# Patient Record
Sex: Male | Born: 1950 | Race: White | Hispanic: No | Marital: Single | Smoking: Former smoker
Health system: Southern US, Community
[De-identification: ages and names within clinical notes are randomized; demographics above are authoritative.]

## PROBLEM LIST (undated history)

## (undated) DIAGNOSIS — R7303 Prediabetes: Secondary | ICD-10-CM

## (undated) DIAGNOSIS — R51 Headache: Secondary | ICD-10-CM

## (undated) DIAGNOSIS — E039 Hypothyroidism, unspecified: Secondary | ICD-10-CM

## (undated) DIAGNOSIS — K219 Gastro-esophageal reflux disease without esophagitis: Secondary | ICD-10-CM

## (undated) DIAGNOSIS — G4730 Sleep apnea, unspecified: Secondary | ICD-10-CM

## (undated) DIAGNOSIS — C4490 Unspecified malignant neoplasm of skin, unspecified: Secondary | ICD-10-CM

## (undated) DIAGNOSIS — J189 Pneumonia, unspecified organism: Secondary | ICD-10-CM

## (undated) DIAGNOSIS — H269 Unspecified cataract: Secondary | ICD-10-CM

## (undated) DIAGNOSIS — R112 Nausea with vomiting, unspecified: Secondary | ICD-10-CM

## (undated) DIAGNOSIS — Z87442 Personal history of urinary calculi: Secondary | ICD-10-CM

## (undated) DIAGNOSIS — M1990 Unspecified osteoarthritis, unspecified site: Secondary | ICD-10-CM

## (undated) DIAGNOSIS — C73 Malignant neoplasm of thyroid gland: Secondary | ICD-10-CM

## (undated) DIAGNOSIS — Z9889 Other specified postprocedural states: Secondary | ICD-10-CM

## (undated) DIAGNOSIS — R519 Headache, unspecified: Secondary | ICD-10-CM

## (undated) HISTORY — PX: COLONOSCOPY: SHX174

## (undated) HISTORY — PX: APPENDECTOMY: SHX54

## (undated) HISTORY — PX: SKIN CANCER EXCISION: SHX779

## (undated) HISTORY — PX: TONSILLECTOMY AND ADENOIDECTOMY: SUR1326

---

## 1989-10-24 DIAGNOSIS — C73 Malignant neoplasm of thyroid gland: Secondary | ICD-10-CM

## 1989-10-24 HISTORY — DX: Malignant neoplasm of thyroid gland: C73

## 1989-10-24 HISTORY — PX: THYROID SURGERY: SHX805

## 1999-12-01 ENCOUNTER — Ambulatory Visit (HOSPITAL_COMMUNITY): Admission: RE | Admit: 1999-12-01 | Discharge: 1999-12-01 | Payer: Self-pay | Admitting: Gastroenterology

## 2009-05-29 ENCOUNTER — Encounter: Admission: RE | Admit: 2009-05-29 | Discharge: 2009-05-29 | Payer: Self-pay | Admitting: Endocrinology

## 2010-05-28 ENCOUNTER — Encounter: Admission: RE | Admit: 2010-05-28 | Discharge: 2010-05-28 | Payer: Self-pay | Admitting: Endocrinology

## 2011-03-11 NOTE — Procedures (Signed)
. [facility]  Patient:    Joshua Graves, Joshua Graves                       MRN: [id number] Proc. Date: 12/01/99 Adm. Date:  [id number] Attending:  Rich Brave CC:         Veverly Fells. [place], M.D.                           Procedure Report  PROCEDURE:  Colonoscopy.  ENDOSCOPIST:  Florencia Reasons, M.D.  INDICATION:  [age]-year-old with history of intermittent small-volume hematochezia and family history of colon cancer.  FINDINGS:  Normal exam to the cecum.  Hypertrophied anal papilla present.  DESCRIPTION OF PROCEDURE:  The nature, purpose and risks of the procedure had been discussed with the patient who provided written consent.  Sedation was Fentanyl 60 mcg and Versed 6 mg IV, without arrhythmias or desaturation.  The Olympus adult  videocolonoscope was advanced easily to the cecum, as identified by clear visualization of the ileocecal valve and the absence of further lumen. Pullback was then performed.  The quality of the prep was excellent and it was felt that all areas were well-seen.  This was a normal examination.  No polyps, cancer, colitis, vascular malformations or diverticular disease were observed.  Retroflexion in the rectum did show a hypertrophied anal papilla.  Pullout through the anal canal did not show excessive internal hemorrhoids.  The patient tolerated the procedure well and there were o apparent complications.  IMPRESSION:  Essentially normal examination.  The patients rectal bleeding is felt most likely to be due to intermittent anal fissuring or perhaps occasional hemorrhoids.  PLAN:  Consider repeat colonoscopy in five years, in view of the family history of colon cancer. DD:  12/01/99 TD:  12/01/99 Job: [id number]

## 2011-05-27 ENCOUNTER — Ambulatory Visit
Admission: RE | Admit: 2011-05-27 | Discharge: 2011-05-27 | Disposition: A | Discharge status: Home / Self Care | Payer: BC Managed Care – PPO | Source: Ambulatory Visit | Attending: Endocrinology | Admitting: Endocrinology

## 2011-05-27 ENCOUNTER — Other Ambulatory Visit: Payer: Self-pay | Admitting: Endocrinology

## 2011-05-27 DIAGNOSIS — Z0000 Encounter for general adult medical examination without abnormal findings: Secondary | ICD-10-CM

## 2013-10-23 ENCOUNTER — Other Ambulatory Visit (HOSPITAL_COMMUNITY): Payer: Self-pay | Admitting: Nurse Practitioner

## 2013-10-23 ENCOUNTER — Ambulatory Visit (HOSPITAL_COMMUNITY)
Admission: RE | Admit: 2013-10-23 | Discharge: 2013-10-23 | Disposition: A | Discharge status: Home / Self Care | Payer: BC Managed Care – PPO | Source: Ambulatory Visit | Attending: Nurse Practitioner | Admitting: Nurse Practitioner

## 2013-10-23 DIAGNOSIS — M79672 Pain in left foot: Secondary | ICD-10-CM

## 2013-10-23 DIAGNOSIS — M79609 Pain in unspecified limb: Secondary | ICD-10-CM | POA: Insufficient documentation

## 2013-10-23 DIAGNOSIS — IMO0002 Reserved for concepts with insufficient information to code with codable children: Secondary | ICD-10-CM | POA: Insufficient documentation

## 2013-10-23 DIAGNOSIS — M7989 Other specified soft tissue disorders: Secondary | ICD-10-CM | POA: Insufficient documentation

## 2013-10-23 DIAGNOSIS — X58XXXA Exposure to other specified factors, initial encounter: Secondary | ICD-10-CM | POA: Insufficient documentation

## 2014-07-29 ENCOUNTER — Other Ambulatory Visit: Payer: Self-pay | Admitting: Endocrinology

## 2014-07-29 DIAGNOSIS — M8580 Other specified disorders of bone density and structure, unspecified site: Secondary | ICD-10-CM

## 2015-09-29 ENCOUNTER — Other Ambulatory Visit: Payer: Self-pay | Admitting: Endocrinology

## 2015-09-29 DIAGNOSIS — M8580 Other specified disorders of bone density and structure, unspecified site: Secondary | ICD-10-CM

## 2015-10-13 ENCOUNTER — Ambulatory Visit
Admission: RE | Admit: 2015-10-13 | Discharge: 2015-10-13 | Disposition: A | Discharge status: Home / Self Care | Payer: BLUE CROSS/BLUE SHIELD | Source: Ambulatory Visit | Attending: Endocrinology | Admitting: Endocrinology

## 2015-10-13 DIAGNOSIS — M8580 Other specified disorders of bone density and structure, unspecified site: Secondary | ICD-10-CM

## 2017-11-16 NOTE — Progress Notes (Signed)
Please place orders in Epic as patient is being scheduled for a pre-op appointment! Thank you! 

## 2017-12-13 NOTE — Patient Instructions (Addendum)
AUM CAGGIANO  12/13/2017   Your procedure is scheduled on: 12-20-17   Report to [facility] Main  Entrance Follow signs to Short Stay on first floor at 530 AM    Call this number if you have problems the morning of surgery [phone]   Remember: Do not eat food or drink liquids :After Midnight.     Take these medicines the morning of surgery with A SIP OF WATER: Omeprazole (Prilosec). You may also bring and use your eyedrops and inhaler as needed.                               You may not have any metal on your body including hair pins and              piercings  Do not wear jewelry, lotions, powders or deodorant             Men may shave face and neck.   Do not bring valuables to the hospital. [place].  Contacts, dentures or bridgework may not be worn into surgery.  Leave suitcase in the car. After surgery it may be brought to your room.   Please bring your mask and tubing for your CPAP machine                Please read over the following fact sheets you were given: _____________________________________________________________________          [facility] - Preparing for Surgery Before surgery, you can play an important role.  Because skin is not sterile, your skin needs to be as free of germs as possible.  You can reduce the number of germs on your skin by washing with CHG (chlorahexidine gluconate) soap before surgery.  CHG is an antiseptic cleaner which kills germs and bonds with the skin to continue killing germs even after washing. Please DO NOT use if you have an allergy to CHG or antibacterial soaps.  If your skin becomes reddened/irritated stop using the CHG and inform your nurse when you arrive at Short Stay. Do not shave (including legs and underarms) for at least 48 hours prior to the first CHG shower.  You may shave your face/neck. Please follow these instructions carefully:  1.  Shower with  CHG Soap the night before surgery and the  morning of Surgery.  2.  If you choose to wash your hair, wash your hair first as usual with your  normal  shampoo.  3.  After you shampoo, rinse your hair and body thoroughly to remove the  shampoo.                           4.  Use CHG as you would any other liquid soap.  You can apply chg directly  to the skin and wash                       Gently with a scrungie or clean washcloth.  5.  Apply the CHG Soap to your body ONLY FROM THE NECK DOWN.   Do not use on face/ open  Wound or open sores. Avoid contact with eyes, ears mouth and genitals (private parts).                       Wash face,  Genitals (private parts) with your normal soap.             6.  Wash thoroughly, paying special attention to the area where your surgery  will be performed.  7.  Thoroughly rinse your body with warm water from the neck down.  8.  DO NOT shower/wash with your normal soap after using and rinsing off  the CHG Soap.                9.  Pat yourself dry with a clean towel.            10.  Wear clean pajamas.            11.  Place clean sheets on your bed the night of your first shower and do not  sleep with pets. Day of Surgery : Do not apply any lotions/deodorants the morning of surgery.  Please wear clean clothes to the hospital/surgery center.  FAILURE TO FOLLOW THESE INSTRUCTIONS MAY RESULT IN THE CANCELLATION OF YOUR SURGERY PATIENT SIGNATURE_________________________________  NURSE SIGNATURE__________________________________  ________________________________________________________________________   Adam Phenix  An incentive spirometer is a tool that can help keep your lungs clear and active. This tool measures how well you are filling your lungs with each breath. Taking long deep breaths may help reverse or decrease the chance of developing breathing (pulmonary) problems (especially infection) following:  A long period of  time when you are unable to move or be active. BEFORE THE PROCEDURE   If the spirometer includes an indicator to show your best effort, your nurse or respiratory therapist will set it to a desired goal.  If possible, sit up straight or lean slightly forward. Try not to slouch.  Hold the incentive spirometer in an upright position. INSTRUCTIONS FOR USE  1. Sit on the edge of your bed if possible, or sit up as far as you can in bed or on a chair. 2. Hold the incentive spirometer in an upright position. 3. Breathe out normally. 4. Place the mouthpiece in your mouth and seal your lips tightly around it. 5. Breathe in slowly and as deeply as possible, raising the piston or the ball toward the top of the column. 6. Hold your breath for 3-5 seconds or for as long as possible. Allow the piston or ball to fall to the bottom of the column. 7. Remove the mouthpiece from your mouth and breathe out normally. 8. Rest for a few seconds and repeat Steps 1 through 7 at least 10 times every 1-2 hours when you are awake. Take your time and take a few normal breaths between deep breaths. 9. The spirometer may include an indicator to show your best effort. Use the indicator as a goal to work toward during each repetition. 10. After each set of 10 deep breaths, practice coughing to be sure your lungs are clear. If you have an incision (the cut made at the time of surgery), support your incision when coughing by placing a pillow or rolled up towels firmly against it. Once you are able to get out of bed, walk around indoors and cough well. You may stop using the incentive spirometer when instructed by your caregiver.  RISKS AND COMPLICATIONS  Take your time so you do not get  dizzy or light-headed.  If you are in pain, you may need to take or ask for pain medication before doing incentive spirometry. It is harder to take a deep breath if you are having pain. AFTER USE  Rest and breathe slowly and easily.  It can  be helpful to keep track of a log of your progress. Your caregiver can provide you with a simple table to help with this. If you are using the spirometer at home, follow these instructions: [place] IF:   You are having difficultly using the spirometer.  You have trouble using the spirometer as often as instructed.  Your pain medication is not giving enough relief while using the spirometer.  You develop fever of 100.5 F (38.1 C) or higher. SEEK IMMEDIATE MEDICAL CARE IF:   You cough up bloody sputum that had not been present before.  You develop fever of 102 F (38.9 C) or greater.  You develop worsening pain at or near the incision site. MAKE SURE YOU:   Understand these instructions.  Will watch your condition.  Will get help right away if you are not doing well or get worse. Document Released: 02/20/2007 Document Revised: 01/02/2012 Document Reviewed: 04/23/2007 ExitCare Patient Information 2014 ExitCare, [place].   ________________________________________________________________________  WHAT IS A BLOOD TRANSFUSION? Blood Transfusion Information  A transfusion is the replacement of blood or some of its parts. Blood is made up of multiple cells which provide different functions.  Red blood cells carry oxygen and are used for blood loss replacement.  White blood cells fight against infection.  Platelets control bleeding.  Plasma helps clot blood.  Other blood products are available for specialized needs, such as hemophilia or other clotting disorders. BEFORE THE TRANSFUSION  Who gives blood for transfusions?   Healthy volunteers who are fully evaluated to make sure their blood is safe. This is blood bank blood. Transfusion therapy is the safest it has ever been in the practice of medicine. Before blood is taken from a donor, a complete history is taken to make sure that person has no history of diseases nor engages in risky social behavior (examples are  intravenous drug use or sexual activity with multiple partners). The donor's travel history is screened to minimize risk of transmitting infections, such as malaria. The donated blood is tested for signs of infectious diseases, such as HIV and hepatitis. The blood is then tested to be sure it is compatible with you in order to minimize the chance of a transfusion reaction. If you or a relative donates blood, this is often done in anticipation of surgery and is not appropriate for emergency situations. It takes many days to process the donated blood. RISKS AND COMPLICATIONS Although transfusion therapy is very safe and saves many lives, the main dangers of transfusion include:   Getting an infectious disease.  Developing a transfusion reaction. This is an allergic reaction to something in the blood you were given. Every precaution is taken to prevent this. The decision to have a blood transfusion has been considered carefully by your caregiver before blood is given. Blood is not given unless the benefits outweigh the risks. AFTER THE TRANSFUSION  Right after receiving a blood transfusion, you will usually feel much better and more energetic. This is especially true if your red blood cells have gotten low (anemic). The transfusion raises the level of the red blood cells which carry oxygen, and this usually causes an energy increase.  The nurse administering the transfusion will  monitor you carefully for complications. HOME CARE INSTRUCTIONS  No special instructions are needed after a transfusion. You may find your energy is better. Speak with your caregiver about any limitations on activity for underlying diseases you may have. SEEK MEDICAL CARE IF:   Your condition is not improving after your transfusion.  You develop redness or irritation at the intravenous (IV) site. SEEK IMMEDIATE MEDICAL CARE IF:  Any of the following symptoms occur over the next 12 hours:  Shaking chills.  You have a  temperature by mouth above 102 F (38.9 C), not controlled by medicine.  Chest, back, or muscle pain.  People around you feel you are not acting correctly or are confused.  Shortness of breath or difficulty breathing.  Dizziness and fainting.  You get a rash or develop hives.  You have a decrease in urine output.  Your urine turns a dark color or changes to pink, red, or [place]. Any of the following symptoms occur over the next 10 days:  You have a temperature by mouth above 102 F (38.9 C), not controlled by medicine.  Shortness of breath.  Weakness after normal activity.  The white part of the eye turns yellow (jaundice).  You have a decrease in the amount of urine or are urinating less often.  Your urine turns a dark color or changes to pink, red, or [place]. Document Released: 10/07/2000 Document Revised: 01/02/2012 Document Reviewed: 05/26/2008 [facility] Patient Information 2014 [place], [place].  _______________________________________________________________________

## 2017-12-14 ENCOUNTER — Encounter (HOSPITAL_COMMUNITY)
Admission: RE | Admit: 2017-12-14 | Discharge: 2017-12-14 | Disposition: A | Discharge status: Home / Self Care | Payer: Medicare HMO | Source: Ambulatory Visit | Attending: Orthopedic Surgery | Admitting: Orthopedic Surgery

## 2017-12-14 ENCOUNTER — Ambulatory Visit (HOSPITAL_COMMUNITY)
Admission: RE | Admit: 2017-12-14 | Discharge: 2017-12-14 | Disposition: A | Discharge status: Home / Self Care | Payer: Medicare HMO | Source: Ambulatory Visit | Attending: Surgical | Admitting: Surgical

## 2017-12-14 ENCOUNTER — Other Ambulatory Visit: Payer: Self-pay

## 2017-12-14 ENCOUNTER — Encounter (HOSPITAL_COMMUNITY): Payer: Self-pay

## 2017-12-14 DIAGNOSIS — Z01812 Encounter for preprocedural laboratory examination: Secondary | ICD-10-CM | POA: Insufficient documentation

## 2017-12-14 DIAGNOSIS — Z01810 Encounter for preprocedural cardiovascular examination: Secondary | ICD-10-CM | POA: Insufficient documentation

## 2017-12-14 DIAGNOSIS — Z01818 Encounter for other preprocedural examination: Secondary | ICD-10-CM | POA: Insufficient documentation

## 2017-12-14 DIAGNOSIS — R9431 Abnormal electrocardiogram [ECG] [EKG]: Secondary | ICD-10-CM | POA: Diagnosis not present

## 2017-12-14 DIAGNOSIS — M1712 Unilateral primary osteoarthritis, left knee: Secondary | ICD-10-CM | POA: Diagnosis not present

## 2017-12-14 HISTORY — DX: Hypothyroidism, unspecified: E03.9

## 2017-12-14 HISTORY — DX: Sleep apnea, unspecified: G47.30

## 2017-12-14 HISTORY — DX: Gastro-esophageal reflux disease without esophagitis: K21.9

## 2017-12-14 HISTORY — DX: Headache, unspecified: R51.9

## 2017-12-14 HISTORY — DX: Prediabetes: R73.03

## 2017-12-14 HISTORY — DX: Headache: R51

## 2017-12-14 HISTORY — DX: Pneumonia, unspecified organism: J18.9

## 2017-12-14 LAB — URINALYSIS, ROUTINE W REFLEX MICROSCOPIC
Bilirubin Urine: NEGATIVE
Glucose, UA: NEGATIVE mg/dL
Hgb urine dipstick: NEGATIVE
Ketones, ur: NEGATIVE mg/dL
Leukocytes, UA: NEGATIVE
Nitrite: NEGATIVE
Protein, ur: NEGATIVE mg/dL
Specific Gravity, Urine: 1.016 (ref 1.005–1.030)
pH: 5 (ref 5.0–8.0)

## 2017-12-14 LAB — COMPREHENSIVE METABOLIC PANEL
ALT: 28 U/L (ref 17–63)
AST: 21 U/L (ref 15–41)
Albumin: 4.4 g/dL (ref 3.5–5.0)
Alkaline Phosphatase: 87 U/L (ref 38–126)
Anion gap: 11 (ref 5–15)
BUN: 19 mg/dL (ref 6–20)
CO2: 24 mmol/L (ref 22–32)
Calcium: 9.2 mg/dL (ref 8.9–10.3)
Chloride: 106 mmol/L (ref 101–111)
Creatinine, Ser: 0.9 mg/dL (ref 0.61–1.24)
GFR calc Af Amer: >60 mL/min (ref >60)
GFR calc non Af Amer: >60 mL/min (ref >60)
Glucose, Bld: 91 mg/dL (ref 65–99)
Potassium: 4.3 mmol/L (ref 3.5–5.1)
Sodium: 141 mmol/L (ref 135–145)
Total Bilirubin: 1.4 mg/dL — ABNORMAL HIGH (ref 0.3–1.2)
Total Protein: 7.2 g/dL (ref 6.5–8.1)

## 2017-12-14 LAB — CBC WITH DIFFERENTIAL/PLATELET
Basophils Absolute: 0 10*3/uL (ref 0.0–0.1)
Basophils Relative: 0 %
Eosinophils Absolute: 0 10*3/uL (ref 0.0–0.7)
Eosinophils Relative: 0 %
HCT: 45.5 % (ref 39.0–52.0)
Hemoglobin: 15.3 g/dL (ref 13.0–17.0)
Lymphocytes Relative: 8 %
Lymphs Abs: 0.9 10*3/uL (ref 0.7–4.0)
MCH: 33.1 pg (ref 26.0–34.0)
MCHC: 33.6 g/dL (ref 30.0–36.0)
MCV: 98.5 fL (ref 78.0–100.0)
Monocytes Absolute: 0.7 10*3/uL (ref 0.1–1.0)
Monocytes Relative: 7 %
Neutro Abs: 9.5 10*3/uL — ABNORMAL HIGH (ref 1.7–7.7)
Neutrophils Relative %: 85 %
Platelets: 245 10*3/uL (ref 150–400)
RBC: 4.62 MIL/uL (ref 4.22–5.81)
RDW: 13.5 % (ref 11.5–15.5)
WBC: 11.3 10*3/uL — ABNORMAL HIGH (ref 4.0–10.5)

## 2017-12-14 LAB — APTT: aPTT: 32 seconds (ref 24–36)

## 2017-12-14 LAB — PROTIME-INR
INR: 0.97
Prothrombin Time: 12.8 seconds (ref 11.4–15.2)

## 2017-12-14 LAB — SURGICAL PCR SCREEN
MRSA, PCR: NEGATIVE
Staphylococcus aureus: NEGATIVE

## 2017-12-15 LAB — ABO/RH: ABO/RH(D): O POS

## 2017-12-15 LAB — HEMOGLOBIN A1C
Hgb A1c MFr Bld: 5.9 % — ABNORMAL HIGH (ref 4.8–5.6)
MEAN PLASMA GLUCOSE: 122.63 mg/dL

## 2017-12-15 NOTE — Progress Notes (Signed)
Called to advise patient that he was negative for Staph. Pt reported that he just came down with a low grade fever and congestion yesterday. Pt stated that he feels awful, and has been resting all day. Patient advised to contact Dr. Buzzy Han office. Also left voice message with Vel[facility] to advise of the above.   Spoke to Dr. Smith Robert regarding EKG result. Pt does report chest pain with exercise. Pt is a smoker and stated that he assumed it was Pulmonary related. Dr. Smith Robert indicated that they will evaluate pt on day of surgery.

## 2017-12-20 ENCOUNTER — Encounter (HOSPITAL_COMMUNITY): Admission: RE | Payer: Self-pay | Source: Ambulatory Visit

## 2017-12-20 ENCOUNTER — Inpatient Hospital Stay (HOSPITAL_COMMUNITY): Admission: RE | Admit: 2017-12-20 | Payer: Medicare HMO | Source: Ambulatory Visit | Admitting: Orthopedic Surgery

## 2017-12-20 LAB — TYPE AND SCREEN
ABO/RH(D): O POS
Antibody Screen: NEGATIVE

## 2017-12-20 SURGERY — ARTHROPLASTY, KNEE, TOTAL
Anesthesia: Choice | Site: Knee | Laterality: Left

## 2018-02-08 ENCOUNTER — Other Ambulatory Visit: Payer: Self-pay | Admitting: Surgical

## 2018-02-08 ENCOUNTER — Ambulatory Visit: Payer: Self-pay | Admitting: Surgical

## 2018-02-14 ENCOUNTER — Encounter (HOSPITAL_COMMUNITY): Payer: Self-pay

## 2018-02-14 NOTE — Pre-Procedure Instructions (Signed)
Surgical clearance from Dr. Elyse Hsu on chart  The following are in epic: EKG 12/14/2017

## 2018-02-14 NOTE — Patient Instructions (Addendum)
Your procedure is scheduled on: Wednesday, Feb 21, 2018   Surgery Time:  8:30AM-10:30AM   Report to Center  Entrance    Report to admitting at 6:00 AM   Call this number if you have problems the morning of surgery [phone]   Bring CPAP mask and tubing day of surgery   Do not eat food or drink liquids :After Midnight.   Do NOT smoke after Midnight   Take these medicines the morning of surgery with A SIP OF WATER: Omeprazole                               You may not have any metal on your body including jewelry, and body piercings               Do not wear lotions, powders, perfumes/cologne, or deodorant                          Men may shave face and neck.   Do not bring valuables to the hospital. [place].   Contacts, dentures or bridgework may not be worn into surgery.   Leave suitcase in the car. After surgery it may be brought to your room.   Special Instructions: Bring a copy of your healthcare power of attorney and living will documents         the day of surgery if you haven't scanned them in before.              Please read over the following fact sheets you were given:  [facility] - Preparing for Surgery Before surgery, you can play an important role.  Because skin is not sterile, your skin needs to be as free of germs as possible.  You can reduce the number of germs on your skin by washing with CHG (chlorahexidine gluconate) soap before surgery.  CHG is an antiseptic cleaner which kills germs and bonds with the skin to continue killing germs even after washing. Please DO NOT use if you have an allergy to CHG or antibacterial soaps.  If your skin becomes reddened/irritated stop using the CHG and inform your nurse when you arrive at Short Stay. Do not shave (including legs and underarms) for at least 48 hours prior to the first CHG shower.  You may shave your face/neck.  Please follow these  instructions carefully:  1.  Shower with CHG Soap the night before surgery and the  morning of surgery.  2.  If you choose to wash your hair, wash your hair first as usual with your normal  shampoo.  3.  After you shampoo, rinse your hair and body thoroughly to remove the shampoo.                             4.  Use CHG as you would any other liquid soap.  You can apply chg directly to the skin and wash.  Gently with a scrungie or clean washcloth.  5.  Apply the CHG Soap to your body ONLY FROM THE NECK DOWN.   Do   not use on face/ open  Wound or open sores. Avoid contact with eyes, ears mouth and   genitals (private parts).                       Wash face,  Genitals (private parts) with your normal soap.             6.  Wash thoroughly, paying special attention to the area where your    surgery  will be performed.  7.  Thoroughly rinse your body with warm water from the neck down.  8.  DO NOT shower/wash with your normal soap after using and rinsing off the CHG Soap.                9.  Pat yourself dry with a clean towel.            10.  Wear clean pajamas.            11.  Place clean sheets on your bed the night of your first shower and do not  sleep with pets. Day of Surgery : Do not apply any lotions/deodorants the morning of surgery.  Please wear clean clothes to the hospital/surgery center.  FAILURE TO FOLLOW THESE INSTRUCTIONS MAY RESULT IN THE CANCELLATION OF YOUR SURGERY  PATIENT SIGNATURE_________________________________  NURSE SIGNATURE__________________________________  ________________________________________________________________________   Adam Phenix  An incentive spirometer is a tool that can help keep your lungs clear and active. This tool measures how well you are filling your lungs with each breath. Taking long deep breaths may help reverse or decrease the chance of developing breathing (pulmonary) problems (especially infection)  following:  A long period of time when you are unable to move or be active. BEFORE THE PROCEDURE   If the spirometer includes an indicator to show your best effort, your nurse or respiratory therapist will set it to a desired goal.  If possible, sit up straight or lean slightly forward. Try not to slouch.  Hold the incentive spirometer in an upright position. INSTRUCTIONS FOR USE  1. Sit on the edge of your bed if possible, or sit up as far as you can in bed or on a chair. 2. Hold the incentive spirometer in an upright position. 3. Breathe out normally. 4. Place the mouthpiece in your mouth and seal your lips tightly around it. 5. Breathe in slowly and as deeply as possible, raising the piston or the ball toward the top of the column. 6. Hold your breath for 3-5 seconds or for as long as possible. Allow the piston or ball to fall to the bottom of the column. 7. Remove the mouthpiece from your mouth and breathe out normally. 8. Rest for a few seconds and repeat Steps 1 through 7 at least 10 times every 1-2 hours when you are awake. Take your time and take a few normal breaths between deep breaths. 9. The spirometer may include an indicator to show your best effort. Use the indicator as a goal to work toward during each repetition. 10. After each set of 10 deep breaths, practice coughing to be sure your lungs are clear. If you have an incision (the cut made at the time of surgery), support your incision when coughing by placing a pillow or rolled up towels firmly against it. Once you are able to get out of bed, walk around indoors and cough well. You may stop using the incentive spirometer when instructed by your caregiver.  RISKS AND COMPLICATIONS  Take your time  so you do not get dizzy or light-headed.  If you are in pain, you may need to take or ask for pain medication before doing incentive spirometry. It is harder to take a deep breath if you are having pain. AFTER USE  Rest and  breathe slowly and easily.  It can be helpful to keep track of a log of your progress. Your caregiver can provide you with a simple table to help with this. If you are using the spirometer at home, follow these instructions: [place] IF:   You are having difficultly using the spirometer.  You have trouble using the spirometer as often as instructed.  Your pain medication is not giving enough relief while using the spirometer.  You develop fever of 100.5 F (38.1 C) or higher. SEEK IMMEDIATE MEDICAL CARE IF:   You cough up bloody sputum that had not been present before.  You develop fever of 102 F (38.9 C) or greater.  You develop worsening pain at or near the incision site. MAKE SURE YOU:   Understand these instructions.  Will watch your condition.  Will get help right away if you are not doing well or get worse. Document Released: 02/20/2007 Document Revised: 01/02/2012 Document Reviewed: 04/23/2007 Exit[employer] Patient Information 2014 ExitCare, [place].   ________________________________________________________________________  WHAT IS A BLOOD TRANSFUSION? Blood Transfusion Information  A transfusion is the replacement of blood or some of its parts. Blood is made up of multiple cells which provide different functions.  Red blood cells carry oxygen and are used for blood loss replacement.  White blood cells fight against infection.  Platelets control bleeding.  Plasma helps clot blood.  Other blood products are available for specialized needs, such as hemophilia or other clotting disorders. BEFORE THE TRANSFUSION  Who gives blood for transfusions?   Healthy volunteers who are fully evaluated to make sure their blood is safe. This is blood bank blood. Transfusion therapy is the safest it has ever been in the practice of medicine. Before blood is taken from a donor, a complete history is taken to make sure that person has no history of diseases nor engages in  risky social behavior (examples are intravenous drug use or sexual activity with multiple partners). The donor's travel history is screened to minimize risk of transmitting infections, such as malaria. The donated blood is tested for signs of infectious diseases, such as HIV and hepatitis. The blood is then tested to be sure it is compatible with you in order to minimize the chance of a transfusion reaction. If you or a relative donates blood, this is often done in anticipation of surgery and is not appropriate for emergency situations. It takes many days to process the donated blood. RISKS AND COMPLICATIONS Although transfusion therapy is very safe and saves many lives, the main dangers of transfusion include:   Getting an infectious disease.  Developing a transfusion reaction. This is an allergic reaction to something in the blood you were given. Every precaution is taken to prevent this. The decision to have a blood transfusion has been considered carefully by your caregiver before blood is given. Blood is not given unless the benefits outweigh the risks. AFTER THE TRANSFUSION  Right after receiving a blood transfusion, you will usually feel much better and more energetic. This is especially true if your red blood cells have gotten low (anemic). The transfusion raises the level of the red blood cells which carry oxygen, and this usually causes an energy increase.  The  nurse administering the transfusion will monitor you carefully for complications. HOME CARE INSTRUCTIONS  No special instructions are needed after a transfusion. You may find your energy is better. Speak with your caregiver about any limitations on activity for underlying diseases you may have. SEEK MEDICAL CARE IF:   Your condition is not improving after your transfusion.  You develop redness or irritation at the intravenous (IV) site. SEEK IMMEDIATE MEDICAL CARE IF:  Any of the following symptoms occur over the next 12  hours:  Shaking chills.  You have a temperature by mouth above 102 F (38.9 C), not controlled by medicine.  Chest, back, or muscle pain.  People around you feel you are not acting correctly or are confused.  Shortness of breath or difficulty breathing.  Dizziness and fainting.  You get a rash or develop hives.  You have a decrease in urine output.  Your urine turns a dark color or changes to pink, red, or [place]. Any of the following symptoms occur over the next 10 days:  You have a temperature by mouth above 102 F (38.9 C), not controlled by medicine.  Shortness of breath.  Weakness after normal activity.  The white part of the eye turns yellow (jaundice).  You have a decrease in the amount of urine or are urinating less often.  Your urine turns a dark color or changes to pink, red, or [place]. Document Released: 10/07/2000 Document Revised: 01/02/2012 Document Reviewed: 05/26/2008 [facility] Patient Information 2014 [place], [place].  _______________________________________________________________________

## 2018-02-15 ENCOUNTER — Encounter (HOSPITAL_COMMUNITY)
Admission: RE | Admit: 2018-02-15 | Discharge: 2018-02-15 | Disposition: A | Discharge status: Home / Self Care | Payer: Medicare HMO | Source: Ambulatory Visit | Attending: Orthopedic Surgery | Admitting: Orthopedic Surgery

## 2018-02-15 ENCOUNTER — Ambulatory Visit (HOSPITAL_COMMUNITY)
Admission: RE | Admit: 2018-02-15 | Discharge: 2018-02-15 | Disposition: A | Discharge status: Home / Self Care | Payer: Medicare HMO | Source: Ambulatory Visit | Attending: Anesthesiology | Admitting: Anesthesiology

## 2018-02-15 ENCOUNTER — Encounter (HOSPITAL_COMMUNITY): Payer: Self-pay

## 2018-02-15 ENCOUNTER — Other Ambulatory Visit: Payer: Self-pay

## 2018-02-15 DIAGNOSIS — Z01812 Encounter for preprocedural laboratory examination: Secondary | ICD-10-CM | POA: Diagnosis not present

## 2018-02-15 DIAGNOSIS — Z01818 Encounter for other preprocedural examination: Secondary | ICD-10-CM

## 2018-02-15 DIAGNOSIS — Z01810 Encounter for preprocedural cardiovascular examination: Secondary | ICD-10-CM | POA: Insufficient documentation

## 2018-02-15 DIAGNOSIS — M1712 Unilateral primary osteoarthritis, left knee: Secondary | ICD-10-CM | POA: Insufficient documentation

## 2018-02-15 HISTORY — DX: Malignant neoplasm of thyroid gland: C73

## 2018-02-15 HISTORY — DX: Unspecified osteoarthritis, unspecified site: M19.90

## 2018-02-15 LAB — COMPREHENSIVE METABOLIC PANEL
ALT: 50 U/L (ref 17–63)
AST: 26 U/L (ref 15–41)
Albumin: 4.1 g/dL (ref 3.5–5.0)
Alkaline Phosphatase: 79 U/L (ref 38–126)
Anion gap: 11 (ref 5–15)
BUN: 19 mg/dL (ref 6–20)
CO2: 24 mmol/L (ref 22–32)
Calcium: 9 mg/dL (ref 8.9–10.3)
Chloride: 106 mmol/L (ref 101–111)
Creatinine, Ser: 0.92 mg/dL (ref 0.61–1.24)
GFR calc Af Amer: >60 mL/min (ref >60)
GFR calc non Af Amer: >60 mL/min (ref >60)
Glucose, Bld: 97 mg/dL (ref 65–99)
Potassium: 4.5 mmol/L (ref 3.5–5.1)
Sodium: 141 mmol/L (ref 135–145)
Total Bilirubin: 1.4 mg/dL — ABNORMAL HIGH (ref 0.3–1.2)
Total Protein: 7 g/dL (ref 6.5–8.1)

## 2018-02-15 LAB — CBC WITH DIFFERENTIAL/PLATELET
Basophils Absolute: 0 10*3/uL (ref 0.0–0.1)
Basophils Relative: 0 %
Eosinophils Absolute: 0.2 10*3/uL (ref 0.0–0.7)
Eosinophils Relative: 3 %
HCT: 41.5 % (ref 39.0–52.0)
Hemoglobin: 13.9 g/dL (ref 13.0–17.0)
Lymphocytes Relative: 22 %
Lymphs Abs: 1.4 10*3/uL (ref 0.7–4.0)
MCH: 32.3 pg (ref 26.0–34.0)
MCHC: 33.5 g/dL (ref 30.0–36.0)
MCV: 96.3 fL (ref 78.0–100.0)
Monocytes Absolute: 0.7 10*3/uL (ref 0.1–1.0)
Monocytes Relative: 12 %
Neutro Abs: 3.8 10*3/uL (ref 1.7–7.7)
Neutrophils Relative %: 63 %
Platelets: 221 10*3/uL (ref 150–400)
RBC: 4.31 MIL/uL (ref 4.22–5.81)
RDW: 13.7 % (ref 11.5–15.5)
WBC: 6.1 10*3/uL (ref 4.0–10.5)

## 2018-02-15 LAB — URINALYSIS, ROUTINE W REFLEX MICROSCOPIC
Bilirubin Urine: NEGATIVE
Glucose, UA: NEGATIVE mg/dL
Hgb urine dipstick: NEGATIVE
Ketones, ur: NEGATIVE mg/dL
Leukocytes, UA: NEGATIVE
Nitrite: NEGATIVE
Protein, ur: NEGATIVE mg/dL
Specific Gravity, Urine: 1.019 (ref 1.005–1.030)
pH: 5 (ref 5.0–8.0)

## 2018-02-15 LAB — PROTIME-INR
INR: 0.99
Prothrombin Time: 13 seconds (ref 11.4–15.2)

## 2018-02-15 LAB — HEMOGLOBIN A1C
HEMOGLOBIN A1C: 5.6 % (ref 4.8–5.6)
Mean Plasma Glucose: 114.02 mg/dL

## 2018-02-15 LAB — SURGICAL PCR SCREEN
MRSA, PCR: NEGATIVE
STAPHYLOCOCCUS AUREUS: NEGATIVE

## 2018-02-15 LAB — APTT: aPTT: 29 seconds (ref 24–36)

## 2018-02-20 MED ORDER — BUPIVACAINE LIPOSOME 1.3 % IJ SUSP
20.0000 mL | Freq: Once | INTRAMUSCULAR | Status: AC
  Administered 2018-02-21: 20 mL
  Filled 2018-02-20: qty 20

## 2018-02-20 NOTE — H&P (Signed)
TOTAL KNEE ADMISSION H&P  Patient is being admitted for left total knee arthroplasty.  Subjective:  Chief Complaint:left knee pain.  HPI: Joshua Graves, 67 y.o. male, has a history of pain and functional disability in the left knee due to arthritis and has failed non-surgical conservative treatments for greater than 12 weeks to includeNSAID's and/or analgesics, corticosteriod injections, viscosupplementation injections, flexibility and strengthening excercises and activity modification.  Onset of symptoms was gradual, starting 5 years ago with gradually worsening course since that time. The patient noted prior procedures on the knee to include  arthroscopy and menisectomy on the left knee(s).  Patient currently rates pain in the left knee(s) at 8 out of 10 with activity. Patient has night pain, worsening of pain with activity and weight bearing, pain that interferes with activities of daily living, pain with passive range of motion, crepitus and joint swelling.  Patient has evidence of periarticular osteophytes and joint space narrowing by imaging studies. There is no active infection.   Past Medical History:  Diagnosis Date  . Anginal pain ([place])   . Depression    Medication prescribed, but patient did not get prescription filled  . GERD (gastroesophageal reflux disease)   . Headache    Sinus headaches  . Hypothyroidism   . OA (osteoarthritis)   . Pneumonia   . Pre-diabetes   . Sleep apnea    CPAP  . Thyroid cancer ([place]) 1991    Past Surgical History:  Procedure Laterality Date  . APPENDECTOMY    . COLONOSCOPY    . THYROID SURGERY  1991  . TONSILLECTOMY AND ADENOIDECTOMY      Current Facility-Administered Medications  Medication Dose Route Frequency Provider Last Rate Last Dose  . [START ON 02/21/2018] bupivacaine liposome (EXPAREL) 1.3 % injection 266 mg  20 mL Infiltration Once Latanya Maudlin, MD       Current Outpatient Medications  Medication Sig Dispense Refill Last Dose   . aspirin EC 81 MG tablet Take 81 mg by mouth daily.     Marland Kitchen atorvastatin (LIPITOR) 40 MG tablet Take 40 mg by mouth at bedtime.      . Calcium Carbonate-Vitamin D (CALTRATE 600+D) 600-400 MG-UNIT tablet Take 1 tablet by mouth 2 (two) times daily.      . Cholecalciferol (VITAMIN D) 2000 units CAPS Take 2,000 Units by mouth daily.     . fluticasone (FLONASE) 50 MCG/ACT nasal spray Place 1 spray into both nostrils 2 (two) times daily as needed (for allergies.).      Marland Kitchen ibuprofen (ADVIL,MOTRIN) 200 MG tablet Take 400-800 mg by mouth 3 (three) times daily as needed for headache or moderate pain.      Marland Kitchen levothyroxine (SYNTHROID, LEVOTHROID) 200 MCG tablet Take 200 mcg by mouth at bedtime.      . Multiple Vitamin (MULTIVITAMIN WITH MINERALS) TABS tablet Take 1 tablet by mouth daily.     . Omega-3 Fatty Acids (FISH OIL PO) Take 660 mg by mouth daily.      Marland Kitchen omeprazole (PRILOSEC OTC) 20 MG tablet Take 20 mg by mouth daily.      No Known Allergies  Social History   Tobacco Use  . Smoking status: Former Smoker    Packs/day: 2.00    Years: 52.00    Pack years: 104.00    Types: Cigarettes    Last attempt to quit: 12/21/2017    Years since quitting: 0.1  . Smokeless tobacco: Never Used  Substance Use Topics  . Alcohol use: Yes  Alcohol/week: 3.6 oz    Types: 6 Cans of beer per week    Comment: Every 2-3 days      Review of Systems  Constitutional: Negative.   HENT: Positive for hearing loss. Negative for congestion, ear discharge, ear pain, nosebleeds, sinus pain, sore throat and tinnitus.   Eyes: Negative.   Respiratory: Negative.  Negative for stridor.   Cardiovascular: Negative.   Gastrointestinal: Negative.   Genitourinary: Negative.   Musculoskeletal: Positive for joint pain and myalgias. Negative for back pain, falls and neck pain.  Skin: Negative.   Neurological: Negative.   Endo/Heme/Allergies: Negative.   Psychiatric/Behavioral: Negative.     Objective:  Physical Exam   Constitutional: He is oriented to person, place, and time. He appears well-developed. No distress.  Obese  HENT:  Head: Normocephalic and atraumatic.  Right Ear: External ear normal.  Left Ear: External ear normal.  Nose: Nose normal.  Mouth/Throat: Oropharynx is clear and moist.  Eyes: Conjunctivae and EOM are normal.  Neck: Normal range of motion. Neck supple.  Cardiovascular: Normal rate, regular rhythm, normal heart sounds and intact distal pulses.  No murmur heard. Respiratory: Effort normal and breath sounds normal. No respiratory distress. He has no wheezes.  GI: Soft. Bowel sounds are normal. He exhibits no distension. There is no tenderness.  Musculoskeletal:       Right hip: Normal.       Left hip: Normal.       Right knee: Normal.       Left knee: He exhibits decreased range of motion and swelling. He exhibits no effusion and no erythema. Tenderness found. Medial joint line and lateral joint line tenderness noted.  Moderate tenderness to palpation about the medial joint line of the left knee. ROM left knee 5-105 degrees. No effusion noted in the left knee. No instability about the left knee. Moderate patellofemoral crepitus in the left knee. Genu varus.  Neurological: He is alert and oriented to person, place, and time. He has normal strength and normal reflexes. No sensory deficit.  Skin: No rash noted. He is not diaphoretic. No erythema.  Psychiatric: He has a normal mood and affect. His behavior is normal.   Vitals Ht: 6 ft 1 in  Wt: 238 lbs  BMI: 31.4  BP: 145/95  HR:     84 bpm  Imaging Review Plain radiographs demonstrate severe degenerative joint disease of the left knee(s). The overall alignment ismild varus. The bone quality appears to be good for age and reported activity level.   Preoperative templating of the joint replacement has been completed, documented, and submitted to the Operating Room personnel in order to optimize intra-operative equipment  management.   Anticipated LOS equal to or greater than 2 midnights due to - Age 78 and older with one or more of the following:  - Obesity  - Expected need for hospital services (PT, OT, Nursing) required for safe  discharge  - Anticipated need for postoperative skilled nursing care or inpatient rehab  - Active co-morbidities: none OR   - Unanticipated findings during/Post Surgery: None  - Patient is a high risk of re-admission due to: None     Assessment/Plan:  End stage primary osteoarthritis, left knee   The patient history, physical examination, clinical judgment of the provider and imaging studies are consistent with end stage degenerative joint disease of the left knee(s) and total knee arthroplasty is deemed medically necessary. The treatment options including medical management, injection therapy arthroscopy and arthroplasty were  discussed at length. The risks and benefits of total knee arthroplasty were presented and reviewed. The risks due to aseptic loosening, infection, stiffness, patella tracking problems, thromboembolic complications and other imponderables were discussed. The patient acknowledged the explanation, agreed to proceed with the plan and consent was signed. Patient is being admitted for inpatient treatment for surgery, pain control, PT, OT, prophylactic antibiotics, VTE prophylaxis, progressive ambulation and ADL's and discharge planning. The patient is planning to be discharged home with home health services    Therapy Plans: HHPT then outpatient therapy Disposition: Home with girlffriend Planned DVT prophylaxis: aspirin 325mg  BID DME needed: cane and 3-n-1 PCP: Dr. Elyse Hsu Other: TXA IV    Ardeen Jourdain, PA-C

## 2018-02-20 NOTE — Anesthesia Preprocedure Evaluation (Addendum)
Anesthesia Evaluation  Patient identified by MRN, date of birth, ID band Patient awake    Reviewed: Allergy & Precautions, NPO status , Patient's Chart, lab work & pertinent test results  Airway Mallampati: III  TM Distance: >3 FB Neck ROM: Full    Dental  (+) Chipped,    Pulmonary sleep apnea and Continuous Positive Airway Pressure Ventilation , former smoker,    Pulmonary exam normal breath sounds clear to auscultation       Cardiovascular negative cardio ROS Normal cardiovascular exam Rhythm:Regular Rate:Normal  ECG: NSR, rate 89   Neuro/Psych  Headaches, PSYCHIATRIC DISORDERS Depression    GI/Hepatic Neg liver ROS, GERD  Medicated and Controlled,  Endo/Other  Hypothyroidism   Renal/GU negative Renal ROS     Musculoskeletal negative musculoskeletal ROS (+)   Abdominal (+) + obese,   Peds  Hematology HLD   Anesthesia Other Findings Left Knee Osteoarthritis  Reproductive/Obstetrics                            Anesthesia Physical Anesthesia Plan  ASA: III  Anesthesia Plan: Spinal and Regional   Post-op Pain Management:  Regional for Post-op pain   Induction:   PONV Risk Score and Plan: 1 and Ondansetron, Dexamethasone, Propofol infusion, Treatment may vary due to age or medical condition and Midazolam  Airway Management Planned: Natural Airway  Additional Equipment:   Intra-op Plan:   Post-operative Plan:   Informed Consent: I have reviewed the patients History and Physical, chart, labs and discussed the procedure including the risks, benefits and alternatives for the proposed anesthesia with the patient or authorized representative who has indicated his/her understanding and acceptance.   Dental advisory given  Plan Discussed with: CRNA  Anesthesia Plan Comments:        Anesthesia Quick Evaluation

## 2018-02-21 ENCOUNTER — Other Ambulatory Visit: Payer: Self-pay

## 2018-02-21 ENCOUNTER — Inpatient Hospital Stay (HOSPITAL_COMMUNITY): Payer: Medicare HMO | Admitting: Anesthesiology

## 2018-02-21 ENCOUNTER — Inpatient Hospital Stay (HOSPITAL_COMMUNITY)
Admission: RE | Admit: 2018-02-21 | Discharge: 2018-02-23 | DRG: 470 | Disposition: A | Discharge status: Home Health | Payer: Medicare HMO | Source: Ambulatory Visit | Attending: Orthopedic Surgery | Admitting: Orthopedic Surgery

## 2018-02-21 ENCOUNTER — Encounter (HOSPITAL_COMMUNITY): Payer: Self-pay

## 2018-02-21 ENCOUNTER — Encounter (HOSPITAL_COMMUNITY)
Admission: RE | Disposition: A | Discharge status: Home Health | Payer: Self-pay | Source: Ambulatory Visit | Attending: Orthopedic Surgery

## 2018-02-21 DIAGNOSIS — Z6831 Body mass index (BMI) 31.0-31.9, adult: Secondary | ICD-10-CM

## 2018-02-21 DIAGNOSIS — E785 Hyperlipidemia, unspecified: Secondary | ICD-10-CM | POA: Diagnosis present

## 2018-02-21 DIAGNOSIS — E039 Hypothyroidism, unspecified: Secondary | ICD-10-CM | POA: Diagnosis present

## 2018-02-21 DIAGNOSIS — K219 Gastro-esophageal reflux disease without esophagitis: Secondary | ICD-10-CM | POA: Diagnosis present

## 2018-02-21 DIAGNOSIS — Z7982 Long term (current) use of aspirin: Secondary | ICD-10-CM

## 2018-02-21 DIAGNOSIS — M24562 Contracture, left knee: Secondary | ICD-10-CM | POA: Diagnosis present

## 2018-02-21 DIAGNOSIS — Z85850 Personal history of malignant neoplasm of thyroid: Secondary | ICD-10-CM

## 2018-02-21 DIAGNOSIS — Z87891 Personal history of nicotine dependence: Secondary | ICD-10-CM

## 2018-02-21 DIAGNOSIS — M1712 Unilateral primary osteoarthritis, left knee: Secondary | ICD-10-CM | POA: Diagnosis present

## 2018-02-21 DIAGNOSIS — Z96652 Presence of left artificial knee joint: Secondary | ICD-10-CM

## 2018-02-21 DIAGNOSIS — E669 Obesity, unspecified: Secondary | ICD-10-CM | POA: Diagnosis present

## 2018-02-21 DIAGNOSIS — G4730 Sleep apnea, unspecified: Secondary | ICD-10-CM | POA: Diagnosis present

## 2018-02-21 HISTORY — PX: TOTAL KNEE ARTHROPLASTY: SHX125

## 2018-02-21 LAB — TYPE AND SCREEN
ABO/RH(D): O POS
Antibody Screen: NEGATIVE

## 2018-02-21 SURGERY — ARTHROPLASTY, KNEE, TOTAL
Anesthesia: Regional | Site: Knee | Laterality: Left

## 2018-02-21 MED ORDER — HYDROCODONE-ACETAMINOPHEN 7.5-325 MG PO TABS
1.0000 | ORAL_TABLET | ORAL | Status: DC | PRN
  Administered 2018-02-22: 2 via ORAL
  Filled 2018-02-21: qty 2

## 2018-02-21 MED ORDER — METOCLOPRAMIDE HCL 5 MG/ML IJ SOLN: 5.0000 mg | Freq: Three times a day (TID) | INTRAMUSCULAR | Status: DC | PRN

## 2018-02-21 MED ORDER — PHENYLEPHRINE HCL 10 MG/ML IJ SOLN: 30.0000 ug/min | INTRAVENOUS | Status: DC

## 2018-02-21 MED ORDER — ONDANSETRON HCL 4 MG/2ML IJ SOLN: 4.0000 mg | Freq: Four times a day (QID) | INTRAMUSCULAR | Status: DC | PRN

## 2018-02-21 MED ORDER — CHLORHEXIDINE GLUCONATE 4 % EX LIQD: 60.0000 mL | Freq: Once | CUTANEOUS | Status: DC

## 2018-02-21 MED ORDER — LACTATED RINGERS IV SOLN
INTRAVENOUS | Status: DC
  Administered 2018-02-21 (×2): via INTRAVENOUS

## 2018-02-21 MED ORDER — GABAPENTIN 300 MG PO CAPS
300.0000 mg | ORAL_CAPSULE | Freq: Three times a day (TID) | ORAL | Status: DC
  Administered 2018-02-21 – 2018-02-23 (×6): 300 mg via ORAL
  Filled 2018-02-21 (×3): qty 1
  Filled 2018-02-21: qty 3
  Filled 2018-02-21 (×2): qty 1

## 2018-02-21 MED ORDER — THIAMINE HCL 100 MG/ML IJ SOLN: 100.0000 mg | Freq: Once | INTRAMUSCULAR | Status: DC

## 2018-02-21 MED ORDER — CEFAZOLIN SODIUM-DEXTROSE 2-4 GM/100ML-% IV SOLN
INTRAVENOUS | Status: AC
  Filled 2018-02-21: qty 100

## 2018-02-21 MED ORDER — BISACODYL 5 MG PO TBEC: 5.0000 mg | DELAYED_RELEASE_TABLET | Freq: Every day | ORAL | Status: DC | PRN

## 2018-02-21 MED ORDER — LACTATED RINGERS IV SOLN
INTRAVENOUS | Status: DC
  Administered 2018-02-21 – 2018-02-22 (×3): via INTRAVENOUS

## 2018-02-21 MED ORDER — ACETAMINOPHEN 500 MG PO TABS
500.0000 mg | ORAL_TABLET | Freq: Four times a day (QID) | ORAL | Status: AC
  Administered 2018-02-21 – 2018-02-22 (×3): 500 mg via ORAL
  Filled 2018-02-21 (×3): qty 1

## 2018-02-21 MED ORDER — PHENOL 1.4 % MT LIQD: 1.0000 | OROMUCOSAL | Status: DC | PRN

## 2018-02-21 MED ORDER — METOCLOPRAMIDE HCL 5 MG PO TABS: 5.0000 mg | ORAL_TABLET | Freq: Three times a day (TID) | ORAL | Status: DC | PRN

## 2018-02-21 MED ORDER — MORPHINE SULFATE (PF) 2 MG/ML IV SOLN: 0.5000 mg | INTRAVENOUS | Status: DC | PRN

## 2018-02-21 MED ORDER — ASPIRIN EC 325 MG PO TBEC: 325.0000 mg | DELAYED_RELEASE_TABLET | Freq: Two times a day (BID) | ORAL | 0 refills | Status: DC

## 2018-02-21 MED ORDER — SODIUM CHLORIDE 0.9 % IJ SOLN
INTRAMUSCULAR | Status: DC | PRN
  Administered 2018-02-21: 20 mL

## 2018-02-21 MED ORDER — PROPOFOL 10 MG/ML IV BOLUS
INTRAVENOUS | Status: DC | PRN
  Administered 2018-02-21 (×2): 30 mg via INTRAVENOUS
  Administered 2018-02-21 (×2): 20 mg via INTRAVENOUS

## 2018-02-21 MED ORDER — SODIUM CHLORIDE 0.9 % IV SOLN
INTRAVENOUS | Status: AC
  Filled 2018-02-21: qty 500000

## 2018-02-21 MED ORDER — FENTANYL CITRATE (PF) 100 MCG/2ML IJ SOLN: 25.0000 ug | INTRAMUSCULAR | Status: DC | PRN

## 2018-02-21 MED ORDER — PHENYLEPHRINE HCL 10 MG/ML IJ SOLN
INTRAVENOUS | Status: DC | PRN
  Administered 2018-02-21: 40 ug/min via INTRAVENOUS

## 2018-02-21 MED ORDER — LORAZEPAM 1 MG PO TABS: 1.0000 mg | ORAL_TABLET | Freq: Every day | ORAL | Status: DC

## 2018-02-21 MED ORDER — DOCUSATE SODIUM 100 MG PO CAPS
100.0000 mg | ORAL_CAPSULE | Freq: Two times a day (BID) | ORAL | Status: DC
  Administered 2018-02-22 – 2018-02-23 (×3): 100 mg via ORAL
  Filled 2018-02-21 (×4): qty 1

## 2018-02-21 MED ORDER — DEXAMETHASONE SODIUM PHOSPHATE 10 MG/ML IJ SOLN
INTRAMUSCULAR | Status: DC | PRN
  Administered 2018-02-21: 10 mg via INTRAVENOUS

## 2018-02-21 MED ORDER — CEFAZOLIN SODIUM-DEXTROSE 1-4 GM/50ML-% IV SOLN
1.0000 g | Freq: Four times a day (QID) | INTRAVENOUS | Status: AC
  Administered 2018-02-21 (×2): 1 g via INTRAVENOUS
  Filled 2018-02-21 (×2): qty 50

## 2018-02-21 MED ORDER — ACETAMINOPHEN 325 MG PO TABS: 325.0000 mg | ORAL_TABLET | Freq: Four times a day (QID) | ORAL | Status: DC | PRN

## 2018-02-21 MED ORDER — LORAZEPAM 1 MG PO TABS: 1.0000 mg | ORAL_TABLET | Freq: Two times a day (BID) | ORAL | Status: DC

## 2018-02-21 MED ORDER — POLYMYXIN B SULFATE 500000 UNITS IJ SOLR
INTRAMUSCULAR | Status: DC | PRN
  Administered 2018-02-21: 500 mL

## 2018-02-21 MED ORDER — LOPERAMIDE HCL 2 MG PO CAPS: 2.0000 mg | ORAL_CAPSULE | ORAL | Status: DC | PRN

## 2018-02-21 MED ORDER — PROPOFOL 500 MG/50ML IV EMUL
INTRAVENOUS | Status: DC | PRN
  Administered 2018-02-21: 100 ug/kg/min via INTRAVENOUS

## 2018-02-21 MED ORDER — ONDANSETRON 4 MG PO TBDP: 4.0000 mg | ORAL_TABLET | Freq: Four times a day (QID) | ORAL | Status: DC | PRN

## 2018-02-21 MED ORDER — GABAPENTIN 300 MG PO CAPS: 300.0000 mg | ORAL_CAPSULE | Freq: Three times a day (TID) | ORAL | 0 refills | Status: DC

## 2018-02-21 MED ORDER — ROPIVACAINE HCL 5 MG/ML IJ SOLN
INTRAMUSCULAR | Status: DC | PRN
  Administered 2018-02-21: 30 mL via PERINEURAL

## 2018-02-21 MED ORDER — HYDROCODONE-ACETAMINOPHEN 5-325 MG PO TABS
1.0000 | ORAL_TABLET | ORAL | Status: DC | PRN
  Administered 2018-02-21 – 2018-02-23 (×9): 2 via ORAL
  Filled 2018-02-21 (×9): qty 2

## 2018-02-21 MED ORDER — MIDAZOLAM HCL 2 MG/2ML IJ SOLN
1.0000 mg | INTRAMUSCULAR | Status: DC | PRN
  Administered 2018-02-21: 2 mg via INTRAVENOUS

## 2018-02-21 MED ORDER — SODIUM CHLORIDE 0.9 % IJ SOLN
INTRAMUSCULAR | Status: AC
  Filled 2018-02-21: qty 50

## 2018-02-21 MED ORDER — FLEET ENEMA 7-19 GM/118ML RE ENEM: 1.0000 | ENEMA | Freq: Once | RECTAL | Status: DC | PRN

## 2018-02-21 MED ORDER — FERROUS SULFATE 325 (65 FE) MG PO TABS
325.0000 mg | ORAL_TABLET | Freq: Three times a day (TID) | ORAL | Status: DC
  Administered 2018-02-22 – 2018-02-23 (×5): 325 mg via ORAL
  Filled 2018-02-21 (×5): qty 1

## 2018-02-21 MED ORDER — METHYLPREDNISOLONE ACETATE 80 MG/ML IJ SUSP
80.0000 mg | Freq: Once | INTRAMUSCULAR | Status: DC
  Filled 2018-02-21: qty 1

## 2018-02-21 MED ORDER — LORAZEPAM 1 MG PO TABS
1.0000 mg | ORAL_TABLET | Freq: Three times a day (TID) | ORAL | Status: AC
  Administered 2018-02-22 – 2018-02-23 (×3): 1 mg via ORAL
  Filled 2018-02-21 (×3): qty 1

## 2018-02-21 MED ORDER — MIDAZOLAM HCL 2 MG/2ML IJ SOLN
INTRAMUSCULAR | Status: AC
  Filled 2018-02-21: qty 2

## 2018-02-21 MED ORDER — MIDAZOLAM HCL 5 MG/5ML IJ SOLN
INTRAMUSCULAR | Status: DC | PRN
  Administered 2018-02-21: 1 mg via INTRAVENOUS

## 2018-02-21 MED ORDER — ONDANSETRON HCL 4 MG/2ML IJ SOLN: 4.0000 mg | Freq: Once | INTRAMUSCULAR | Status: DC | PRN

## 2018-02-21 MED ORDER — LEVOTHYROXINE SODIUM 100 MCG PO TABS
200.0000 ug | ORAL_TABLET | Freq: Every day | ORAL | Status: DC
  Administered 2018-02-21 – 2018-02-22 (×2): 200 ug via ORAL
  Filled 2018-02-21 (×2): qty 2

## 2018-02-21 MED ORDER — PROPOFOL 10 MG/ML IV BOLUS
INTRAVENOUS | Status: AC
  Filled 2018-02-21: qty 20

## 2018-02-21 MED ORDER — FENTANYL CITRATE (PF) 100 MCG/2ML IJ SOLN
INTRAMUSCULAR | Status: AC
  Filled 2018-02-21: qty 2

## 2018-02-21 MED ORDER — FENTANYL CITRATE (PF) 100 MCG/2ML IJ SOLN
50.0000 ug | INTRAMUSCULAR | Status: DC | PRN
  Administered 2018-02-21: 50 ug via INTRAVENOUS

## 2018-02-21 MED ORDER — RIVAROXABAN 10 MG PO TABS
10.0000 mg | ORAL_TABLET | Freq: Every day | ORAL | Status: DC
  Administered 2018-02-22 – 2018-02-23 (×2): 10 mg via ORAL
  Filled 2018-02-21 (×2): qty 1

## 2018-02-21 MED ORDER — ALUM & MAG HYDROXIDE-SIMETH 200-200-20 MG/5ML PO SUSP: 30.0000 mL | ORAL | Status: DC | PRN

## 2018-02-21 MED ORDER — PROPOFOL 10 MG/ML IV BOLUS
INTRAVENOUS | Status: AC
  Filled 2018-02-21: qty 40

## 2018-02-21 MED ORDER — BUPIVACAINE IN DEXTROSE 0.75-8.25 % IT SOLN
INTRATHECAL | Status: DC | PRN
  Administered 2018-02-21: 2 mL via INTRATHECAL

## 2018-02-21 MED ORDER — MENTHOL 3 MG MT LOZG: 1.0000 | LOZENGE | OROMUCOSAL | Status: DC | PRN

## 2018-02-21 MED ORDER — CEFAZOLIN SODIUM-DEXTROSE 2-4 GM/100ML-% IV SOLN
2.0000 g | INTRAVENOUS | Status: AC
  Administered 2018-02-21: 2 g via INTRAVENOUS

## 2018-02-21 MED ORDER — TRANEXAMIC ACID 1000 MG/10ML IV SOLN
1000.0000 mg | INTRAVENOUS | Status: AC
  Administered 2018-02-21: 1000 mg via INTRAVENOUS
  Filled 2018-02-21: qty 1100

## 2018-02-21 MED ORDER — PROPOFOL 10 MG/ML IV BOLUS
INTRAVENOUS | Status: AC
  Filled 2018-02-21: qty 60

## 2018-02-21 MED ORDER — VITAMIN B-1 100 MG PO TABS
100.0000 mg | ORAL_TABLET | Freq: Every day | ORAL | Status: DC
  Administered 2018-02-21 – 2018-02-23 (×3): 100 mg via ORAL
  Filled 2018-02-21 (×3): qty 1

## 2018-02-21 MED ORDER — HYDROXYZINE HCL 25 MG PO TABS: 25.0000 mg | ORAL_TABLET | Freq: Four times a day (QID) | ORAL | Status: DC | PRN

## 2018-02-21 MED ORDER — POLYETHYLENE GLYCOL 3350 17 G PO PACK: 17.0000 g | PACK | Freq: Every day | ORAL | Status: DC | PRN

## 2018-02-21 MED ORDER — LORAZEPAM 1 MG PO TABS
1.0000 mg | ORAL_TABLET | Freq: Four times a day (QID) | ORAL | Status: AC
  Administered 2018-02-21 – 2018-02-22 (×4): 1 mg via ORAL
  Filled 2018-02-21 (×4): qty 1

## 2018-02-21 MED ORDER — ADULT MULTIVITAMIN W/MINERALS CH
1.0000 | ORAL_TABLET | Freq: Every day | ORAL | Status: DC
  Administered 2018-02-21 – 2018-02-23 (×3): 1 via ORAL
  Filled 2018-02-21 (×3): qty 1

## 2018-02-21 MED ORDER — FLUTICASONE PROPIONATE 50 MCG/ACT NA SUSP: 1.0000 | Freq: Two times a day (BID) | NASAL | Status: DC | PRN

## 2018-02-21 MED ORDER — OMEPRAZOLE MAGNESIUM 20 MG PO TBEC: 20.0000 mg | DELAYED_RELEASE_TABLET | Freq: Every day | ORAL | Status: DC

## 2018-02-21 MED ORDER — SODIUM CHLORIDE 0.9 % IR SOLN
Status: DC | PRN
  Administered 2018-02-21: 1000 mL

## 2018-02-21 MED ORDER — OMEPRAZOLE 20 MG PO CPDR
20.0000 mg | DELAYED_RELEASE_CAPSULE | Freq: Every day | ORAL | Status: DC
  Administered 2018-02-22 – 2018-02-23 (×2): 20 mg via ORAL
  Filled 2018-02-21 (×2): qty 1

## 2018-02-21 MED ORDER — FENTANYL CITRATE (PF) 100 MCG/2ML IJ SOLN
INTRAMUSCULAR | Status: DC | PRN
  Administered 2018-02-21: 50 ug via INTRAVENOUS

## 2018-02-21 MED ORDER — LORAZEPAM 1 MG PO TABS: 1.0000 mg | ORAL_TABLET | Freq: Four times a day (QID) | ORAL | Status: DC | PRN

## 2018-02-21 MED ORDER — ONDANSETRON HCL 4 MG PO TABS
4.0000 mg | ORAL_TABLET | Freq: Four times a day (QID) | ORAL | Status: DC | PRN
Start: 2018-02-21 — End: 2018-02-23

## 2018-02-21 SURGICAL SUPPLY — 68 items
AGENT HMST SPONGE THK3/8 (HEMOSTASIS) ×1
BAG DECANTER FOR FLEXI CONT (MISCELLANEOUS) ×2 IMPLANT
BAG SPEC THK2 15X12 ZIP CLS (MISCELLANEOUS)
BAG ZIPLOCK 12X15 (MISCELLANEOUS) IMPLANT
BANDAGE ACE 4X5 VEL STRL LF (GAUZE/BANDAGES/DRESSINGS) ×3 IMPLANT
BANDAGE ACE 6X5 VEL STRL LF (GAUZE/BANDAGES/DRESSINGS) ×3 IMPLANT
BLADE SAG 18X100X1.27 (BLADE) ×3 IMPLANT
BLADE SAW SGTL 11.0X1.19X90.0M (BLADE) ×3 IMPLANT
BNDG GAUZE ELAST 4 BULKY (GAUZE/BANDAGES/DRESSINGS) ×4 IMPLANT
CAP KNEE TOTAL 3 SIGMA ×2 IMPLANT
CEMENT HV SMART SET (Cement) ×6 IMPLANT
CLOSURE WOUND 1/2 X4 (GAUZE/BANDAGES/DRESSINGS) ×1
COVER SURGICAL LIGHT HANDLE (MISCELLANEOUS) ×3 IMPLANT
CUFF TOURN SGL QUICK 34 (TOURNIQUET CUFF) ×3
CUFF TRNQT CYL 34X4X40X1 (TOURNIQUET CUFF) ×1 IMPLANT
DECANTER SPIKE VIAL GLASS SM (MISCELLANEOUS) ×3 IMPLANT
DRAPE INCISE IOBAN 66X45 STRL (DRAPES) ×2 IMPLANT
DRAPE TOP 10253 STERILE (DRAPES) IMPLANT
DRAPE U-SHAPE 47X51 STRL (DRAPES) ×3 IMPLANT
DRSG ADAPTIC 3X8 NADH LF (GAUZE/BANDAGES/DRESSINGS) ×3 IMPLANT
DRSG PAD ABDOMINAL 8X10 ST (GAUZE/BANDAGES/DRESSINGS) ×6 IMPLANT
DURAPREP 26ML APPLICATOR (WOUND CARE) ×3 IMPLANT
ELECT BLADE TIP CTD 4 INCH (ELECTRODE) ×3 IMPLANT
ELECT REM PT RETURN 15FT ADLT (MISCELLANEOUS) ×3 IMPLANT
EVACUATOR 1/8 PVC DRAIN (DRAIN) ×3 IMPLANT
FACESHIELD WRAPAROUND (MASK) ×3 IMPLANT
FACESHIELD WRAPAROUND OR TEAM (MASK) ×1 IMPLANT
GAUZE SPONGE 4X4 12PLY STRL (GAUZE/BANDAGES/DRESSINGS) ×3 IMPLANT
GLOVE BIOGEL PI IND STRL 6.5 (GLOVE) ×1 IMPLANT
GLOVE BIOGEL PI IND STRL 8.5 (GLOVE) ×1 IMPLANT
GLOVE BIOGEL PI INDICATOR 6.5 (GLOVE) ×2
GLOVE BIOGEL PI INDICATOR 8.5 (GLOVE) ×2
GLOVE ECLIPSE 8.0 STRL XLNG CF (GLOVE) ×6 IMPLANT
GLOVE SURG SS PI 6.5 STRL IVOR (GLOVE) ×3 IMPLANT
GOWN STRL REUS W/TWL LRG LVL3 (GOWN DISPOSABLE) ×3 IMPLANT
GOWN STRL REUS W/TWL XL LVL3 (GOWN DISPOSABLE) ×3 IMPLANT
HANDPIECE INTERPULSE COAX TIP (DISPOSABLE) ×3
HEMOSTAT SPONGE AVITENE ULTRA (HEMOSTASIS) ×3 IMPLANT
IMMOBILIZER KNEE 20 (SOFTGOODS) ×3
IMMOBILIZER KNEE 20 THIGH 36 (SOFTGOODS) ×1 IMPLANT
MANIFOLD NEPTUNE II (INSTRUMENTS) ×3 IMPLANT
NDL HYPO 21X1.5 SAFETY (NEEDLE) IMPLANT
NEEDLE HYPO 21X1.5 SAFETY (NEEDLE) ×3 IMPLANT
NEEDLE HYPO 22GX1.5 SAFETY (NEEDLE) ×2 IMPLANT
NS IRRIG 1000ML POUR BTL (IV SOLUTION) ×2 IMPLANT
PACK TOTAL KNEE CUSTOM (KITS) ×3 IMPLANT
PAD ABD 8X10 STRL (GAUZE/BANDAGES/DRESSINGS) ×4 IMPLANT
PADDING CAST COTTON 6X4 STRL (CAST SUPPLIES) ×3 IMPLANT
PENCIL SMOKE EVAC W/HOLSTER (ELECTROSURGICAL) ×3 IMPLANT
POSITIONER SURGICAL ARM (MISCELLANEOUS) ×3 IMPLANT
SET HNDPC FAN SPRY TIP SCT (DISPOSABLE) ×1 IMPLANT
SET PAD KNEE POSITIONER (MISCELLANEOUS) ×3 IMPLANT
SPONGE LAP 18X18 RF (DISPOSABLE) IMPLANT
STRIP CLOSURE SKIN 1/2X4 (GAUZE/BANDAGES/DRESSINGS) ×3 IMPLANT
SUT BONE WAX W31G (SUTURE) IMPLANT
SUT MNCRL AB 4-0 PS2 18 (SUTURE) ×3 IMPLANT
SUT STRATAFIX 0 PDS 27 VIOLET (SUTURE) ×3
SUT VIC AB 1 CT1 27 (SUTURE) ×3
SUT VIC AB 1 CT1 27XBRD ANTBC (SUTURE) ×1 IMPLANT
SUT VIC AB 2-0 CT1 27 (SUTURE) ×12
SUT VIC AB 2-0 CT1 TAPERPNT 27 (SUTURE) ×3 IMPLANT
SUTURE STRATFX 0 PDS 27 VIOLET (SUTURE) ×1 IMPLANT
SYR 20CC LL (SYRINGE) ×6 IMPLANT
TOWER CARTRIDGE SMART MIX (DISPOSABLE) ×3 IMPLANT
TRAY FOLEY W/METER SILVER 16FR (SET/KITS/TRAYS/PACK) ×3 IMPLANT
WATER STERILE IRR 1000ML POUR (IV SOLUTION) ×3 IMPLANT
WRAP KNEE MAXI GEL POST OP (GAUZE/BANDAGES/DRESSINGS) ×3 IMPLANT
YANKAUER SUCT BULB TIP 10FT TU (MISCELLANEOUS) ×3 IMPLANT

## 2018-02-21 NOTE — Anesthesia Procedure Notes (Addendum)
Spinal  Patient location during procedure: OR Start time: 02/21/2018 8:32 AM End time: 02/21/2018 8:39 AM Reason for block: at surgeon's request Staffing Anesthesiologist: Murvin Natal, MD Resident/CRNA: Anne Fu, CRNA Performed: resident/CRNA and anesthesiologist  Preanesthetic Checklist Completed: patient identified, site marked, surgical consent, pre-op evaluation, timeout performed, IV checked, risks and benefits discussed and monitors and equipment checked Spinal Block Patient position: sitting Prep: DuraPrep Patient monitoring: heart rate, continuous pulse ox and blood pressure Approach: midline Location: L4-5 Injection technique: single-shot Needle Needle type: Pencan  Needle gauge: 24 G Needle length: 9 cm Assessment Sensory level: T6 Additional Notes Expiration date of kit checked and confirmed. Patient tolerated procedure well, without complications. X 2 attempt with noted clear CSF return. Loss of motor and sensory on exam post injection.

## 2018-02-21 NOTE — Op Note (Signed)
NAME: VANN, OKERLUND MEDICAL RECORD [id number] ACCOUNT [id number] DATE OF BIRTH:January 07, 1951 FACILITY: [facility] LOCATION: [facility] PHYSICIAN:Sincere Berlanga Fransico Setters, MD  OPERATIVE REPORT  DATE OF PROCEDURE:  02/21/2018  SURGEON:  Dr. Latanya Maudlin.  ASSISTANT:  Ardeen Jourdain, PA.  PREOPERATIVE DIAGNOSES:   1.  Severe primary osteoarthritis, left knee, with bone on bone. 2.  Flexion contracture, left knee.  POSTOPERATIVE DIAGNOSES:   1.  Severe primary osteoarthritis, left knee, with bone on bone. 2.  Flexion contracture, left knee.  PROCEDURE:  Left total knee arthroplasty utilizing the DePuy system.  All 3 components were cemented.  The sizes of the components were as follows:  The patella was a size 41 with 3 pegs.  The femoral component was a size 5 left posterior cruciate  sacrificing type.  The tibial tray was a size 5.  The insert was a size 5, 15 mm thickness polyethylene rotating platform.  DESCRIPTION OF PROCEDURE:  Under general anesthesia, routine orthopedic prep and draping of left lower extremity was carried out.  Appropriate timeout was first carried out.  I also appropriately marked the left leg in the holding area.  At this time,  after the timeout and prep, the leg was then placed in the [facility] knee holder after we exsanguinated the leg with the Esmarch and the tourniquet was elevated to 325 mmHg.  The knee then was flexed.  An anterior approach to the knee was carried out.  Two  flaps were created.  I then carried out a median parapatellar incision.  The patella was reflected laterally in the usual fashion.  The knee was flexed and I did medial and lateral meniscectomies and excised the anterior and posterior cruciate ligaments.   Following that, initial drill hole was made in the intercondylar notch.  A guide rod was inserted up the femoral canal.  I then removed 12 mm thickness off of the distal femur with the appropriate guide.  We then measured the femur to be a size 5.   We  carried out our anterior, posterior, and chamfering cuts for a size 5 left femoral component.  We then directed attention to the tibia.  I removed the spinous processes with the oscillating saw.  At this time, we utilized the external guide and we  removed the appropriate amount of bone off the tibial plateau.  Following that, we then inserted our lamina spreaders and removed posterior spurs.  I then tested the ligamentous structures with the appropriate insert.  We felt that a 15 mm spacer block  was the appropriate size.  We then removed out and carried on the remaining part of the procedure.  I did a keel cut out of the tibial plateau in usual fashion.  We then did our notch cut at the distal femur.  The trial components were inserted.  We  first tried a 12.5 and then went to a 15 mm thickness component, had an excellent fit, excellent stability and good motion.  We then did a resurfacing procedure on the patella for a size 41 patella.  Three drill holes were made in the articular surface  of the patella.  At this particular time, all components were removed.  We thoroughly Waterpik'd  out the knee, dried the knee and cemented all 3 components in simultaneously.  No antibiotic was used in the cement.  Note, he had 2 grams of IV Ancef  preop.  At this time, after the components were cemented in place and the cement was hardened,  we then removed all loose pieces of cement.  We Waterpik'd out the knee and then I inserted the permanent rotating platform polyethylene, size 5, 15 mm  thickness.  We reduced the knee.  We had excellent function.  We then irrigated out the knee again and then inserted our Hemovac drain and closed the wound in layers in the usual fashion.  Sterile dressings were applied.  He had an adductor preop by  anesthesia in the left leg.  He also had 2 grams of IV Ancef preop.  He also had tranexamic acid, or TXA, at the beginning of the procedure.  The patient then was  stable.  SN/NUANCE  D:02/21/2018 T:02/21/2018 JOB:[id number]

## 2018-02-21 NOTE — Interval H&P Note (Signed)
History and Physical Interval Note:  02/21/2018 8:16 AM  Joshua Graves  has presented today for surgery, with the diagnosis of Left Knee Osteoarthritis  The various methods of treatment have been discussed with the patient and family. After consideration of risks, benefits and other options for treatment, the patient has consented to  Procedure(s): LEFT TOTAL KNEE ARTHROPLASTY (Left) as a surgical intervention .  The patient's history has been reviewed, patient examined, no change in status, stable for surgery.  I have reviewed the patient's chart and labs.  Questions were answered to the patient's satisfaction.     Latanya Maudlin

## 2018-02-21 NOTE — Transfer of Care (Signed)
Immediate Anesthesia Transfer of Care Note  Patient: Joshua Graves  Procedure(s) Performed: Procedure(s): LEFT TOTAL KNEE ARTHROPLASTY (Left)  Patient Location: PACU  Anesthesia Type:Regional and Spinal  Level of Consciousness:  sedated, patient cooperative and responds to stimulation  Airway & Oxygen Therapy:Patient Spontanous Breathing and Patient connected to face mask oxgen  Post-op Assessment:  Report given to PACU RN and Post -op Vital signs reviewed and stable  Post vital signs:  Reviewed and stable  Last Vitals:  Vitals:   02/21/18 0656  BP: 138/88  Pulse: 66  Resp: 16  Temp: 36.8 C  SpO2: 78%    Complications: No apparent anesthesia complications

## 2018-02-21 NOTE — Brief Op Note (Signed)
02/21/2018  10:27 AM  PATIENT:  Joshua Graves  67 y.o. male  PRE-OPERATIVE DIAGNOSIS:  Left Knee Primary  Osteoarthritis with Flexion contracture  POST-OPERATIVE DIAGNOSIS:  Same as Pre-Op  PROCEDURE:  Procedure(s): LEFT TOTAL KNEE ARTHROPLASTY (Left)  SURGEON:  Surgeon(s) and Role:    Latanya Maudlin, MD - Primary  PHYSICIAN ASSISTANT: Ardeen Jourdain PA  ASSISTANTS:Amber Thurman PA   ANESTHESIA:   general  EBL:  100 mL   BLOOD ADMINISTERED:none  DRAINS: (one ) Hemovact drain(s) in the Left Knee with  Suction Open   LOCAL MEDICATIONS USED:  OTHER 20cc of Exparel and Anesthesia performed an Adductor Block Pre-Op  SPECIMEN:  No Specimen  DISPOSITION OF SPECIMEN:  N/A  COUNTS:  YES  TOURNIQUET:  * Missing tourniquet times found for documented tourniquets in log: [id number] *  DICTATION: .Other Dictation: Dictation Number [id number]  PLAN OF CARE: Admit to inpatient   PATIENT DISPOSITION:  Stable in OR   Delay start of Pharmacological VTE agent (>24hrs) due to surgical blood loss or risk of bleeding: yes

## 2018-02-21 NOTE — Evaluation (Signed)
Physical Therapy Evaluation Patient Details Name: Joshua Graves MRN: [id number] DOB: September 25, 1951 Today's Date: 02/21/2018   History of Present Illness  Pt s/p R TKR  Clinical Impression  Pt s/p R TKR and presents with decreased R LE strength/ROM and post op pain limiting functional mobility.  Pt should progress to dc home with assist of family/friends.    Follow Up Recommendations Home health PT    Equipment Recommendations  None recommended by PT    Recommendations for Other Services       Precautions / Restrictions Precautions Precautions: Knee;Fall Required Braces or Orthoses: Knee Immobilizer - Right Knee Immobilizer - Right: Discontinue once straight leg raise with < 10 degree lag Restrictions Weight Bearing Restrictions: No Other Position/Activity Restrictions: WBAT      Mobility  Bed Mobility Overal bed mobility: Needs Assistance Bed Mobility: Supine to Sit     Supine to sit: Min assist     General bed mobility comments: cues for sequence and min asist for R LE  Transfers Overall transfer level: Needs assistance Equipment used: Rolling walker (2 wheeled) Transfers: Sit to/from Stand Sit to Stand: Min assist         General transfer comment: cues for LE management and use of UEs to self assist  Ambulation/Gait Ambulation/Gait assistance: Min assist Ambulation Distance (Feet): 60 Feet Assistive device: Rolling walker (2 wheeled) Gait Pattern/deviations: Step-to pattern;Decreased step length - right;Decreased step length - left;Shuffle;Trunk flexed Gait velocity: decr   General Gait Details: cues for sequence, posture and position from [employer]            Wheelchair Mobility    Modified Rankin (Stroke Patients Only)       Balance                                             Pertinent Vitals/Pain Pain Assessment: 0-10 Pain Score: 3  Pain Location: R knee Pain Descriptors / Indicators: Aching;Sore Pain  Intervention(s): Limited activity within patient's tolerance;Monitored during session;Premedicated before session;Ice applied    Home Living Family/patient expects to be discharged to:: Private residence Living Arrangements: Spouse/significant other;Non-relatives/Friends Available Help at Discharge: Family;Friend(s);Available 24 hours/day Type of Home: House Home Access: Stairs to enter;Ramped entrance Entrance Stairs-Rails: None Entrance Stairs-Number of Steps: 3 Home Layout: One level Home Equipment: Walker - 2 wheels;Walker - standard;Cane - single point      Prior Function Level of Independence: Independent               Hand Dominance        Extremity/Trunk Assessment   Upper Extremity Assessment Upper Extremity Assessment: Overall WFL for tasks assessed    Lower Extremity Assessment Lower Extremity Assessment: RLE deficits/detail       Communication   Communication: HOH  Cognition Arousal/Alertness: Awake/alert Behavior During Therapy: WFL for tasks assessed/performed Overall Cognitive Status: Within Functional Limits for tasks assessed                                        General Comments      Exercises     Assessment/Plan    PT Assessment Patient needs continued PT services  PT Problem List Decreased strength;Decreased range of motion;Decreased activity tolerance;Decreased mobility;Decreased knowledge of use of DME;Obesity;Pain  PT Treatment Interventions DME instruction;Gait training;Stair training;Functional mobility training;Therapeutic activities;Therapeutic exercise;Patient/family education    PT Goals (Current goals can be found in the Care Plan section)  Acute Rehab PT Goals Patient Stated Goal: Regain IND PT Goal Formulation: With patient Time For Goal Achievement: 02/28/18 Potential to Achieve Goals: Good    Frequency 7X/week   Barriers to discharge        Co-evaluation               AM-PAC PT  "6 Clicks" Daily Activity  Outcome Measure Difficulty turning over in bed (including adjusting bedclothes, sheets and blankets)?: A Lot Difficulty moving from lying on back to sitting on the side of the bed? : A Lot Difficulty sitting down on and standing up from a chair with arms (e.g., wheelchair, bedside commode, etc,.)?: A Lot Help needed moving to and from a bed to chair (including a wheelchair)?: A Little Help needed walking in hospital room?: A Little Help needed climbing 3-5 steps with a railing? : A Little 6 Click Score: 15    End of Session Equipment Utilized During Treatment: Gait belt;Right knee immobilizer Activity Tolerance: Patient tolerated treatment well Patient left: in chair;with call bell/phone within reach;with family/visitor present Nurse Communication: Mobility status PT Visit Diagnosis: Difficulty in walking, not elsewhere classified (R26.2)    Time: 1720-1745 PT Time Calculation (min) (ACUTE ONLY): 25 min   Charges:   PT Evaluation $PT Eval Low Complexity: 1 Low PT Treatments $Gait Training: 8-22 mins   PT G Codes:        Pg [phone]   Katielynn Horan 02/21/2018, 5:59 PM

## 2018-02-21 NOTE — Discharge Instructions (Addendum)

## 2018-02-21 NOTE — Anesthesia Procedure Notes (Signed)
Anesthesia Regional Block: Adductor canal block   Pre-Anesthetic Checklist: ,, timeout performed, Correct Patient, Correct Site, Correct Laterality, Correct Procedure,, site marked, risks and benefits discussed, Surgical consent,  Pre-op evaluation,  At surgeon's request and post-op pain management  Laterality: Left  Prep: chloraprep       Needles:  Injection technique: Single-shot  Needle Type: Echogenic Stimulator Needle     Needle Length: 10cm  Needle Gauge: 21     Additional Needles:   Procedures:,,,, ultrasound used (permanent image in chart),,,,  Narrative:  Start time: 02/21/2018 8:10 AM End time: 02/21/2018 8:20 AM Injection made incrementally with aspirations every 5 mL.  Performed by: Personally  Anesthesiologist: Murvin Natal, MD  Additional Notes: Functioning IV was confirmed and monitors were applied.  A 157mm 21ga [place] echogenic stimulator needle was used. Sterile prep, hand hygiene and sterile gloves were used.  Negative aspiration and negative test dose prior to incremental administration of local anesthetic. The patient tolerated the procedure well.

## 2018-02-22 ENCOUNTER — Encounter (HOSPITAL_COMMUNITY): Payer: Self-pay

## 2018-02-22 LAB — CBC
HCT: 34 % — ABNORMAL LOW (ref 39.0–52.0)
HEMOGLOBIN: 11.3 g/dL — AB (ref 13.0–17.0)
MCH: 32.2 pg (ref 26.0–34.0)
MCHC: 33.2 g/dL (ref 30.0–36.0)
MCV: 96.9 fL (ref 78.0–100.0)
Platelets: 213 10*3/uL (ref 150–400)
RBC: 3.51 MIL/uL — ABNORMAL LOW (ref 4.22–5.81)
RDW: 13.8 % (ref 11.5–15.5)
WBC: 12.3 10*3/uL — ABNORMAL HIGH (ref 4.0–10.5)

## 2018-02-22 LAB — BASIC METABOLIC PANEL
Anion gap: 7 (ref 5–15)
BUN: 17 mg/dL (ref 6–20)
CALCIUM: 8.4 mg/dL — AB (ref 8.9–10.3)
CHLORIDE: 107 mmol/L (ref 101–111)
CO2: 27 mmol/L (ref 22–32)
CREATININE: 0.88 mg/dL (ref 0.61–1.24)
GFR calc Af Amer: >60 mL/min (ref >60)
GFR calc non Af Amer: >60 mL/min (ref >60)
Glucose, Bld: 123 mg/dL — ABNORMAL HIGH (ref 65–99)
Potassium: 4.2 mmol/L (ref 3.5–5.1)
SODIUM: 141 mmol/L (ref 135–145)

## 2018-02-22 NOTE — Progress Notes (Signed)
Physical Therapy Treatment Patient Details Name: Joshua Graves MRN: [id number] DOB: Jan 24, 1951 Today's Date: 02/22/2018    History of Present Illness Pt s/p R TKR    PT Comments    POD # 1 pm session amb an increased distance then assisted back to bed.  performed HS with long sheet.  Applied ICE.    Follow Up Recommendations  Home health PT     Equipment Recommendations  None recommended by PT    Recommendations for Other Services       Precautions / Restrictions Precautions Precautions: Knee;Fall Precaution Booklet Issued: Yes (comment) Precaution Comments: instructed on KI use for amb and stairs Knee Immobilizer - Right: Discontinue once straight leg raise with < 10 degree lag Restrictions Weight Bearing Restrictions: No Other Position/Activity Restrictions: WBAT    Mobility  Bed Mobility Overal bed mobility: Needs Assistance Bed Mobility: Sit to Supine     Supine to sit: Min assist Sit to supine: Min assist   General bed mobility comments: cues for sequence and min asist for R LE back onto bed  Transfers Overall transfer level: Needs assistance Equipment used: Rolling walker (2 wheeled) Transfers: Sit to/from Stand Sit to Stand: Min assist         General transfer comment: cues for LE management and use of UEs to self assist  Ambulation/Gait Ambulation/Gait assistance: Min assist Ambulation Distance (Feet): 110 Feet Assistive device: Rolling walker (2 wheeled) Gait Pattern/deviations: Step-to pattern;Decreased step length - right;Decreased step length - left;Shuffle;Trunk flexed Gait velocity: decr   General Gait Details: cues for sequence, posture and position from [employer]             Wheelchair Mobility    Modified Rankin (Stroke Patients Only)       Balance                                            Cognition Arousal/Alertness: Awake/alert Behavior During Therapy: WFL for tasks  assessed/performed Overall Cognitive Status: Within Functional Limits for tasks assessed                                        Exercises      General Comments        Pertinent Vitals/Pain Pain Assessment: 0-10 Pain Score: 6  Pain Location: R knee Pain Descriptors / Indicators: Aching;Sore Pain Intervention(s): Monitored during session;Patient requesting pain meds-RN notified;Ice applied    Home Living                      Prior Function            PT Goals (current goals can now be found in the care plan section) Progress towards PT goals: Progressing toward goals    Frequency    7X/week      PT Plan Current plan remains appropriate    Co-evaluation              AM-PAC PT "6 Clicks" Daily Activity  Outcome Measure  Difficulty turning over in bed (including adjusting bedclothes, sheets and blankets)?: A Lot Difficulty moving from lying on back to sitting on the side of the bed? : A Lot Difficulty sitting down on and standing up from a chair with arms (e.g.,  wheelchair, bedside commode, etc,.)?: A Lot Help needed moving to and from a bed to chair (including a wheelchair)?: A Lot Help needed walking in hospital room?: A Lot Help needed climbing 3-5 steps with a railing? : Total 6 Click Score: 11    End of Session Equipment Utilized During Treatment: Gait belt;Right knee immobilizer Activity Tolerance: Patient tolerated treatment well Patient left: in chair;with call bell/phone within reach;with family/visitor present Nurse Communication: Mobility status PT Visit Diagnosis: Difficulty in walking, not elsewhere classified (R26.2)     Time: 1315-1340 PT Time Calculation (min) (ACUTE ONLY): 25 min  Charges:  $Gait Training: 8-22 mins $Therapeutic Activity: 8-22 mins                    G Codes:       Rica Koyanagi  PTA [facility]  Acute  Rehab Pager      [phone]

## 2018-02-22 NOTE — Anesthesia Postprocedure Evaluation (Signed)
Anesthesia Post Note  Patient: BARNET BENAVIDES  Procedure(s) Performed: LEFT TOTAL KNEE ARTHROPLASTY (Left Knee)     Patient location during evaluation: PACU Anesthesia Type: Regional and Spinal Level of consciousness: oriented and awake and alert Pain management: pain level controlled Vital Signs Assessment: post-procedure vital signs reviewed and stable Respiratory status: spontaneous breathing, respiratory function stable and patient connected to nasal cannula oxygen Cardiovascular status: blood pressure returned to baseline and stable Postop Assessment: no headache, no backache, no apparent nausea or vomiting and spinal receding Anesthetic complications: no    Last Vitals:  Vitals:   02/22/18 0537 02/22/18 0928  BP: 100/60 114/72  Pulse: 70 70  Resp:  16  Temp: 36.7 C 36.6 C  SpO2: 95% 98%    Last Pain:  Vitals:   02/22/18 0955  TempSrc:   PainSc: 2                  Ryan P Ellender

## 2018-02-22 NOTE — Progress Notes (Signed)
Subjective: 1 Day Post-Op Procedure(s) (LRB): LEFT TOTAL KNEE ARTHROPLASTY (Left) Patient reports pain as 3 on 0-10 scale. He is doing very well today.Hemovac DCd.Plan on DC tomorrow.   Objective: Vital signs in last 24 hours: Temp:  [97.4 F (36.3 C)-98.1 F (36.7 C)] 98.1 F (36.7 C) (05/02 0537) Pulse Rate:  [60-75] 70 (05/02 0537) Resp:  [14-22] 18 (05/01 2032) BP: (93-132)/(60-85) 100/60 (05/02 0537) SpO2:  [94 %-100 %] 95 % (05/02 0537)  Intake/Output from previous day: 05/01 0701 - 05/02 0700 In: 3595 [P.O.:270; I.V.:3115; IV ZOXWRUEAV:409] Out: 8119 [Urine:2475; Drains:680; Blood:100] Intake/Output this shift: No intake/output data recorded.  Recent Labs    02/22/18 0545  HGB 11.3*   Recent Labs    02/22/18 0545  WBC 12.3*  RBC 3.51*  HCT 34.0*  PLT 213   Recent Labs    02/22/18 0545  NA 141  K 4.2  CL 107  CO2 27  BUN 17  CREATININE 0.88  GLUCOSE 123*  CALCIUM 8.4*   No results for input(s): LABPT, INR in the last 72 hours.  Neurologically intact Dorsiflexion/Plantar flexion intact No cellulitis present  Will DC tomorrow. Assessment/Plan: 1 Day Post-Op Procedure(s) (LRB): LEFT TOTAL KNEE ARTHROPLASTY (Left) Up with therapy    Latanya Maudlin 02/22/2018, 7:20 AM

## 2018-02-22 NOTE — Progress Notes (Signed)
Physical Therapy Treatment Patient Details Name: Joshua Graves MRN: [id number] DOB: [birth year] Today's Date: 02/22/2018    History of Present Illness Pt s/p R TKR    PT Comments    POD # 1 am session Applied KI and instructed on KI use for amb and stairs.  Assisted OOB to amb an increased distance.  Returned to room to perform TKR TE's followed by ICE.    Follow Up Recommendations  Home health PT     Equipment Recommendations  None recommended by PT    Recommendations for Other Services       Precautions / Restrictions Precautions Precautions: Knee;Fall Precaution Booklet Issued: Yes (comment) Precaution Comments: instructed on KI use for amb and stairs Knee Immobilizer - Right: Discontinue once straight leg raise with < 10 degree lag Restrictions Weight Bearing Restrictions: No Other Position/Activity Restrictions: WBAT    Mobility  Bed Mobility Overal bed mobility: Needs Assistance Bed Mobility: Supine to Sit     Supine to sit: Min assist     General bed mobility comments: cues for sequence and min asist for R LE  Transfers Overall transfer level: Needs assistance Equipment used: Rolling walker (2 wheeled) Transfers: Sit to/from Stand Sit to Stand: Min assist         General transfer comment: cues for LE management and use of UEs to self assist  Ambulation/Gait Ambulation/Gait assistance: Min assist Ambulation Distance (Feet): 75 Feet Assistive device: Rolling walker (2 wheeled) Gait Pattern/deviations: Step-to pattern;Decreased step length - right;Decreased step length - left;Shuffle;Trunk flexed Gait velocity: decr   General Gait Details: cues for sequence, posture and position from [employer]             Wheelchair Mobility    Modified Rankin (Stroke Patients Only)       Balance                                            Cognition Arousal/Alertness: Awake/alert Behavior During Therapy: WFL for tasks  assessed/performed Overall Cognitive Status: Within Functional Limits for tasks assessed                                        Exercises   Total Knee Replacement TE's 10 reps B LE ankle pumps 10 reps towel squeezes 10 reps knee presses 10 reps heel slides  10 reps SAQ's 10 reps SLR's 10 reps ABD Followed by ICE     General Comments        Pertinent Vitals/Pain Pain Assessment: 0-10 Pain Score: 6  Pain Location: R knee Pain Descriptors / Indicators: Aching;Sore Pain Intervention(s): Monitored during session;Patient requesting pain meds-RN notified;Ice applied    Home Living                      Prior Function            PT Goals (current goals can now be found in the care plan section) Progress towards PT goals: Progressing toward goals    Frequency    7X/week      PT Plan Current plan remains appropriate    Co-evaluation              AM-PAC PT "6 Clicks" Daily Activity  Outcome Measure  Difficulty  turning over in bed (including adjusting bedclothes, sheets and blankets)?: A Lot Difficulty moving from lying on back to sitting on the side of the bed? : A Lot Difficulty sitting down on and standing up from a chair with arms (e.g., wheelchair, bedside commode, etc,.)?: A Lot Help needed moving to and from a bed to chair (including a wheelchair)?: A Lot Help needed walking in hospital room?: A Lot Help needed climbing 3-5 steps with a railing? : Total 6 Click Score: 11    End of Session Equipment Utilized During Treatment: Gait belt;Right knee immobilizer Activity Tolerance: Patient tolerated treatment well Patient left: in chair;with call bell/phone within reach;with family/visitor present Nurse Communication: Mobility status PT Visit Diagnosis: Difficulty in walking, not elsewhere classified (R26.2)     Time: 3552-1747 PT Time Calculation (min) (ACUTE ONLY): 42 min  Charges:  $Gait Training: 8-22 mins $Therapeutic  Exercise: 8-22 mins $Therapeutic Activity: 8-22 mins                    G Codes:       Rica Koyanagi  PTA [facility]  Acute  Rehab Pager      [phone]

## 2018-02-22 NOTE — Progress Notes (Signed)
Discharge planning, spoke with patient at beside. Chose [facility] for HH services, PT to eval and treat. Contacted [facility] for referral. Has RW and 3-n-1. [phone] 

## 2018-02-23 LAB — BASIC METABOLIC PANEL
ANION GAP: 6 (ref 5–15)
BUN: 18 mg/dL (ref 6–20)
CO2: 27 mmol/L (ref 22–32)
Calcium: 7.9 mg/dL — ABNORMAL LOW (ref 8.9–10.3)
Chloride: 104 mmol/L (ref 101–111)
Creatinine, Ser: 0.91 mg/dL (ref 0.61–1.24)
GFR calc Af Amer: >60 mL/min (ref >60)
Glucose, Bld: 111 mg/dL — ABNORMAL HIGH (ref 65–99)
POTASSIUM: 4 mmol/L (ref 3.5–5.1)
SODIUM: 137 mmol/L (ref 135–145)

## 2018-02-23 LAB — CBC
HCT: 32.8 % — ABNORMAL LOW (ref 39.0–52.0)
Hemoglobin: 10.8 g/dL — ABNORMAL LOW (ref 13.0–17.0)
MCH: 32.1 pg (ref 26.0–34.0)
MCHC: 32.9 g/dL (ref 30.0–36.0)
MCV: 97.6 fL (ref 78.0–100.0)
Platelets: 187 10*3/uL (ref 150–400)
RBC: 3.36 MIL/uL — ABNORMAL LOW (ref 4.22–5.81)
RDW: 13.9 % (ref 11.5–15.5)
WBC: 11.3 10*3/uL — ABNORMAL HIGH (ref 4.0–10.5)

## 2018-02-23 NOTE — Progress Notes (Signed)
Physical Therapy Treatment Patient Details Name: Joshua Graves MRN: [id number] DOB: 1951/08/28 Today's Date: 02/23/2018    History of Present Illness Pt s/p R TKR    PT Comments    POD # 2 pm session Applied KI and instructed pt and signifant other proper placement/fit and use.  Assisted with ambulating  in the  hallway.  Practiced 2 steps hoever unsteady so pt and significant other plan to eneter his home via ramp which is already in place.  Returned to room and performed all supine TKR TE's following HEP handout.  Instructed on proper tech, fre aw well as use of ICE.  Pt has met goals to D/C to home today   Follow Up Recommendations  Home health PT     Equipment Recommendations  None recommended by PT    Recommendations for Other Services       Precautions / Restrictions Precautions Precautions: Knee;Fall Precaution Comments: instructed on KI use for amb and stairs Required Braces or Orthoses: Knee Immobilizer - Right Knee Immobilizer - Right: Discontinue once straight leg raise with < 10 degree lag Restrictions Weight Bearing Restrictions: No Other Position/Activity Restrictions: WBAT    Mobility  Bed Mobility Overal bed mobility: Needs Assistance Bed Mobility: Supine to Sit     Supine to sit: Min assist     General bed mobility comments: cues for sequence and min asist for R LE  Transfers Overall transfer level: Needs assistance Equipment used: Rolling walker (2 wheeled) Transfers: Sit to/from Stand Sit to Stand: Supervision;Min guard         General transfer comment: cues for LE management and use of UEs to self assist  Ambulation/Gait Ambulation/Gait assistance: Supervision;Min guard Ambulation Distance (Feet): 115 Feet Assistive device: Rolling walker (2 wheeled) Gait Pattern/deviations: Step-to pattern;Decreased step length - right;Decreased step length - left;Shuffle;Trunk flexed Gait velocity: decr   General Gait Details: cues for sequence,  posture and position from RW   75% VC's to avoid "picking walker up"    Stairs Stairs: Yes Stairs assistance: Mod assist;Min assist Stair Management: No rails;Step to pattern;Forwards;With walker Number of Stairs: 2 General stair comments: with signifcant other attempted stairs however too unsteady.  Pt plans to enter home via ramp whifh is already in place.     Wheelchair Mobility    Modified Rankin (Stroke Patients Only)       Balance                                            Cognition Arousal/Alertness: Awake/alert Behavior During Therapy: WFL for tasks assessed/performed Overall Cognitive Status: Within Functional Limits for tasks assessed                                        Exercises   Total Knee Replacement TE's 10 reps B LE ankle pumps 10 reps towel squeezes 10 reps knee presses 10 reps heel slides  10 reps SAQ's 10 reps SLR's 10 reps ABD Followed by ICE     General Comments        Pertinent Vitals/Pain Pain Assessment: 0-10 Pain Score: 5  Pain Location: R knee Pain Descriptors / Indicators: Aching;Sore;Operative site guarding Pain Intervention(s): Monitored during session;Ice applied;Repositioned    Home Living  Prior Function            PT Goals (current goals can now be found in the care plan section) Progress towards PT goals: Progressing toward goals    Frequency    7X/week      PT Plan Current plan remains appropriate    Co-evaluation              AM-PAC PT "6 Clicks" Daily Activity  Outcome Measure  Difficulty turning over in bed (including adjusting bedclothes, sheets and blankets)?: A Lot Difficulty moving from lying on back to sitting on the side of the bed? : A Lot Difficulty sitting down on and standing up from a chair with arms (e.g., wheelchair, bedside commode, etc,.)?: A Lot Help needed moving to and from a bed to chair (including a  wheelchair)?: A Lot Help needed walking in hospital room?: A Lot Help needed climbing 3-5 steps with a railing? : Total 6 Click Score: 11    End of Session Equipment Utilized During Treatment: Gait belt;Right knee immobilizer Activity Tolerance: Patient tolerated treatment well Patient left: in chair;with call bell/phone within reach;with family/visitor present Nurse Communication: Mobility status PT Visit Diagnosis: Difficulty in walking, not elsewhere classified (R26.2)     Time: 6979-4801 PT Time Calculation (min) (ACUTE ONLY): 27 min  Charges:  $Gait Training: 8-22 mins $Therapeutic Exercise: 8-22 mins $Therapeutic Activity: 8-22 mins                    G Codes:       Rica Koyanagi  PTA [facility]  Acute  Rehab Pager      [phone]

## 2018-02-23 NOTE — Progress Notes (Signed)
   Subjective: 2 Days Post-Op Procedure(s) (LRB): LEFT TOTAL KNEE ARTHROPLASTY (Left) Patient reports pain as mild.   Patient seen in rounds with Dr. Gladstone Lighter. Patient is well, and has had no acute complaints or problems other than pain in the left knee. No SOB or chest pain. Voiding. Positive flatus.    Objective: Vital signs in last 24 hours: Temp:  [97.8 F (36.6 C)-98.8 F (37.1 C)] 98.3 F (36.8 C) (05/03 0513) Pulse Rate:  [70-88] 81 (05/03 0513) Resp:  [16-20] 20 (05/03 0513) BP: (102-114)/(61-78) 113/78 (05/03 0513) SpO2:  [94 %-100 %] 97 % (05/03 0513)  Intake/Output from previous day:  Intake/Output Summary (Last 24 hours) at 02/23/2018 0658 Last data filed at 02/23/2018 0548 Gross per 24 hour  Intake 1055.33 ml  Output 1175 ml  Net -119.67 ml    Intake/Output this shift: Total I/O In: 340 [P.O.:240; I.V.:100] Out: 675 [Urine:675]  Labs: Recent Labs    02/22/18 0545 02/23/18 0546  HGB 11.3* 10.8*   Recent Labs    02/22/18 0545 02/23/18 0546  WBC 12.3* 11.3*  RBC 3.51* 3.36*  HCT 34.0* 32.8*  PLT 213 187   Recent Labs    02/22/18 0545 02/23/18 0546  NA 141 137  K 4.2 4.0  CL 107 104  CO2 27 27  BUN 17 18  CREATININE 0.88 0.91  GLUCOSE 123* 111*  CALCIUM 8.4* 7.9*    EXAM General - Patient is Alert and Oriented Extremity - Neurologically intact Intact pulses distally Dorsiflexion/Plantar flexion intact Compartment soft Dressing/Incision - clean, dry, no drainage Motor Function - intact, moving foot and toes well on exam.   Past Medical History:  Diagnosis Date  . Anginal pain ([place])   . Depression    Medication prescribed, but patient did not get prescription filled  . GERD (gastroesophageal reflux disease)   . Headache    Sinus headaches  . Hypothyroidism   . OA (osteoarthritis)   . Pneumonia   . Pre-diabetes   . Sleep apnea    CPAP  . Thyroid cancer ([place]) 1991    Assessment/Plan: 2 Days Post-Op Procedure(s) (LRB): LEFT  TOTAL KNEE ARTHROPLASTY (Left) Active Problems:   Hx of total knee arthroplasty, left  Estimated body mass index is 31.14 kg/m as calculated from the following:   Height as of this encounter: 6\' 1"  (1.854 m).   Weight as of this encounter: 107 kg (236 lb). Advance diet Up with therapy Discharge home with home health  DVT Prophylaxis - Xarelto Weight-Bearing as tolerated   He is progressing well with therapy. Discussed expectations with therapy at this point. Plan for DC home today after therapy. Will transition to aspirin 325mg  BID for DVT prophylaxis.   Ardeen Jourdain, PA-C Orthopaedic Surgery 02/23/2018, 6:58 AM

## 2018-02-23 NOTE — Progress Notes (Signed)
Physical Therapy Treatment Patient Details Name: PHU RECORD MRN: [id number] DOB: [birth year] Today's Date: 02/23/2018    History of Present Illness Pt s/p R TKR    PT Comments    POD # 2 Applied KI and instructed on proper placement, use and when to D/C.  Assisted OOB to amb an increased distance in hallway.  75% VC's to avoid picking walker up as he steps.  Assisted to bathroom then to recliner.    Follow Up Recommendations  Home health PT     Equipment Recommendations  None recommended by PT    Recommendations for Other Services       Precautions / Restrictions Precautions Precautions: Knee;Fall Precaution Comments: instructed on KI use for amb and stairs Required Braces or Orthoses: Knee Immobilizer - Right Knee Immobilizer - Right: Discontinue once straight leg raise with < 10 degree lag Restrictions Weight Bearing Restrictions: No Other Position/Activity Restrictions: WBAT    Mobility  Bed Mobility Overal bed mobility: Needs Assistance Bed Mobility: Supine to Sit     Supine to sit: Min assist     General bed mobility comments: cues for sequence and min asist for R LE  Transfers Overall transfer level: Needs assistance Equipment used: Rolling walker (2 wheeled) Transfers: Sit to/from Stand Sit to Stand: Supervision;Min guard         General transfer comment: cues for LE management and use of UEs to self assist  Ambulation/Gait Ambulation/Gait assistance: Supervision;Min guard Ambulation Distance (Feet): 115 Feet Assistive device: Rolling walker (2 wheeled) Gait Pattern/deviations: Step-to pattern;Decreased step length - right;Decreased step length - left;Shuffle;Trunk flexed Gait velocity: decr   General Gait Details: cues for sequence, posture and position from RW   75% VC's to avoid "picking walker up"    Stairs             Wheelchair Mobility    Modified Rankin (Stroke Patients Only)       Balance                                             Cognition Arousal/Alertness: Awake/alert Behavior During Therapy: WFL for tasks assessed/performed Overall Cognitive Status: Within Functional Limits for tasks assessed                                        Exercises      General Comments        Pertinent Vitals/Pain Pain Assessment: 0-10 Pain Score: 5  Pain Location: R knee Pain Descriptors / Indicators: Aching;Sore;Operative site guarding Pain Intervention(s): Monitored during session;Ice applied;Repositioned    Home Living                      Prior Function            PT Goals (current goals can now be found in the care plan section) Progress towards PT goals: Progressing toward goals    Frequency    7X/week      PT Plan Current plan remains appropriate    Co-evaluation              AM-PAC PT "6 Clicks" Daily Activity  Outcome Measure  Difficulty turning over in bed (including adjusting bedclothes, sheets and blankets)?: A Lot Difficulty moving from lying on  back to sitting on the side of the bed? : A Lot Difficulty sitting down on and standing up from a chair with arms (e.g., wheelchair, bedside commode, etc,.)?: A Lot Help needed moving to and from a bed to chair (including a wheelchair)?: A Lot Help needed walking in hospital room?: A Lot Help needed climbing 3-5 steps with a railing? : Total 6 Click Score: 11    End of Session Equipment Utilized During Treatment: Gait belt;Right knee immobilizer Activity Tolerance: Patient tolerated treatment well Patient left: in chair;with call bell/phone within reach;with family/visitor present Nurse Communication: Mobility status PT Visit Diagnosis: Difficulty in walking, not elsewhere classified (R26.2)     Time: 8811-0315 PT Time Calculation (min) (ACUTE ONLY): 24 min  Charges:  $Gait Training: 8-22 mins $Therapeutic Activity: 8-22 mins                    G Codes:       Rica Koyanagi   PTA [facility]  Acute  Rehab Pager      [phone]

## 2018-02-25 NOTE — Discharge Summary (Signed)
Physician Discharge Summary   Patient ID: Joshua Graves MRN: [id number] DOB/AGE: [id number] 66 y.o.  Admit date: 02/21/2018 Discharge date: 02/23/2018  Primary Diagnosis: Primary osteoarthritis left knee   Admission Diagnoses:  Past Medical History:  Diagnosis Date  . Anginal pain ([place])   . Depression    Medication prescribed, but patient did not get prescription filled  . GERD (gastroesophageal reflux disease)   . Headache    Sinus headaches  . Hypothyroidism   . OA (osteoarthritis)   . Pneumonia   . Pre-diabetes   . Sleep apnea    CPAP  . Thyroid cancer ([place]) 1991   Discharge Diagnoses:   Active Problems:   Hx of total knee arthroplasty, left  Estimated body mass index is 31.14 kg/m as calculated from the following:   Height as of this encounter: 6' 1"  (1.854 m).   Weight as of this encounter: 107 kg (236 lb).  Procedure:  Procedure(s) (LRB): LEFT TOTAL KNEE ARTHROPLASTY (Left)   Consults: None  HPI: Joshua Graves, 67 y.o. male, has a history of pain and functional disability in the left knee due to arthritis and has failed non-surgical conservative treatments for greater than 12 weeks to includeNSAID's and/or analgesics, corticosteriod injections, viscosupplementation injections, flexibility and strengthening excercises and activity modification.  Onset of symptoms was gradual, starting 5 years ago with gradually worsening course since that time. The patient noted prior procedures on the knee to include  arthroscopy and menisectomy on the left knee(s).  Patient currently rates pain in the left knee(s) at 8 out of 10 with activity. Patient has night pain, worsening of pain with activity and weight bearing, pain that interferes with activities of daily living, pain with passive range of motion, crepitus and joint swelling.  Patient has evidence of periarticular osteophytes and joint space narrowing by imaging studies. There is no active infection.   Laboratory  Data: Admission on 02/21/2018, Discharged on 02/23/2018  Component Date Value Ref Range Status  . WBC 02/22/2018 12.3* 4.0 - 10.5 K/uL Final  . RBC 02/22/2018 3.51* 4.22 - 5.81 MIL/uL Final  . Hemoglobin 02/22/2018 11.3* 13.0 - 17.0 g/dL Final  . HCT 02/22/2018 34.0* 39.0 - 52.0 % Final  . MCV 02/22/2018 96.9  78.0 - 100.0 fL Final  . MCH 02/22/2018 32.2  26.0 - 34.0 pg Final  . MCHC 02/22/2018 33.2  30.0 - 36.0 g/dL Final  . RDW 02/22/2018 13.8  11.5 - 15.5 % Final  . Platelets 02/22/2018 213  150 - 400 K/uL Final   Performed at [facility], [place] [address], [place], [place] [address]  . Sodium 02/22/2018 141  135 - 145 mmol/L Final  . Potassium 02/22/2018 4.2  3.5 - 5.1 mmol/L Final  . Chloride 02/22/2018 107  101 - 111 mmol/L Final  . CO2 02/22/2018 27  22 - 32 mmol/L Final  . Glucose, Bld 02/22/2018 123* 65 - 99 mg/dL Final  . BUN 02/22/2018 17  6 - 20 mg/dL Final  . Creatinine, Ser 02/22/2018 0.88  0.61 - 1.24 mg/dL Final  . Calcium 02/22/2018 8.4* 8.9 - 10.3 mg/dL Final  . GFR calc non Af Amer 02/22/2018 >60  >60 mL/min Final  . GFR calc Af Amer 02/22/2018 >60  >60 mL/min Final   Comment: (NOTE) The eGFR has been calculated using the CKD EPI equation. This calculation has not been validated in all clinical situations. eGFR's persistently <60 mL/min signify possible Chronic Kidney Disease.   Georgiann Hahn  gap 02/22/2018 7  5 - 15 Final   Performed at [facility], [place] [address] [place], [place] [address]  . WBC 02/23/2018 11.3* 4.0 - 10.5 K/uL Final  . RBC 02/23/2018 3.36* 4.22 - 5.81 MIL/uL Final  . Hemoglobin 02/23/2018 10.8* 13.0 - 17.0 g/dL Final  . HCT 02/23/2018 32.8* 39.0 - 52.0 % Final  . MCV 02/23/2018 97.6  78.0 - 100.0 fL Final  . MCH 02/23/2018 32.1  26.0 - 34.0 pg Final  . MCHC 02/23/2018 32.9  30.0 - 36.0 g/dL Final  . RDW 02/23/2018 13.9  11.5 - 15.5 % Final  . Platelets 02/23/2018 187  150 - 400 K/uL Final   Performed at  [facility], [place] [address], [place], [place] [address]  . Sodium 02/23/2018 137  135 - 145 mmol/L Final  . Potassium 02/23/2018 4.0  3.5 - 5.1 mmol/L Final  . Chloride 02/23/2018 104  101 - 111 mmol/L Final  . CO2 02/23/2018 27  22 - 32 mmol/L Final  . Glucose, Bld 02/23/2018 111* 65 - 99 mg/dL Final  . BUN 02/23/2018 18  6 - 20 mg/dL Final  . Creatinine, Ser 02/23/2018 0.91  0.61 - 1.24 mg/dL Final  . Calcium 02/23/2018 7.9* 8.9 - 10.3 mg/dL Final  . GFR calc non Af Amer 02/23/2018 >60  >60 mL/min Final  . GFR calc Af Amer 02/23/2018 >60  >60 mL/min Final   Comment: (NOTE) The eGFR has been calculated using the CKD EPI equation. This calculation has not been validated in all clinical situations. eGFR's persistently <60 mL/min signify possible Chronic Kidney Disease.   Georgiann Hahn gap 02/23/2018 6  5 - 15 Final   Performed at [facility], [place] [address], [place], [place] [address]  Hospital Outpatient Visit on 02/15/2018  Component Date Value Ref Range Status  . aPTT 02/15/2018 29  24 - 36 seconds Final   Performed at [facility], [place] [address] [place], [place] [address]  . WBC 02/15/2018 6.1  4.0 - 10.5 K/uL Final  . RBC 02/15/2018 4.31  4.22 - 5.81 MIL/uL Final  . Hemoglobin 02/15/2018 13.9  13.0 - 17.0 g/dL Final  . HCT 02/15/2018 41.5  39.0 - 52.0 % Final  . MCV 02/15/2018 96.3  78.0 - 100.0 fL Final  . MCH 02/15/2018 32.3  26.0 - 34.0 pg Final  . MCHC 02/15/2018 33.5  30.0 - 36.0 g/dL Final  . RDW 02/15/2018 13.7  11.5 - 15.5 % Final  . Platelets 02/15/2018 221  150 - 400 K/uL Final  . Neutrophils Relative % 02/15/2018 63  % Final  . Neutro Abs 02/15/2018 3.8  1.7 - 7.7 K/uL Final  . Lymphocytes Relative 02/15/2018 22  % Final  . Lymphs Abs 02/15/2018 1.4  0.7 - 4.0 K/uL Final  . Monocytes Relative 02/15/2018 12  % Final  . Monocytes Absolute 02/15/2018 0.7  0.1 - 1.0 K/uL Final  . Eosinophils Relative 02/15/2018 3   % Final  . Eosinophils Absolute 02/15/2018 0.2  0.0 - 0.7 K/uL Final  . Basophils Relative 02/15/2018 0  % Final  . Basophils Absolute 02/15/2018 0.0  0.0 - 0.1 K/uL Final   Performed at [place] [address] [place], [place] [address]  . Sodium 02/15/2018 141  135 - 145 mmol/L Final  . Potassium 02/15/2018 4.5  3.5 - 5.1 mmol/L Final  . Chloride 02/15/2018 106  101 - 111 mmol/L Final  .  CO2 02/15/2018 24  22 - 32 mmol/L Final  . Glucose, Bld 02/15/2018 97  65 - 99 mg/dL Final  . BUN 02/15/2018 19  6 - 20 mg/dL Final  . Creatinine, Ser 02/15/2018 0.92  0.61 - 1.24 mg/dL Final  . Calcium 02/15/2018 9.0  8.9 - 10.3 mg/dL Final  . Total Protein 02/15/2018 7.0  6.5 - 8.1 g/dL Final  . Albumin 02/15/2018 4.1  3.5 - 5.0 g/dL Final  . AST 02/15/2018 26  15 - 41 U/L Final  . ALT 02/15/2018 50  17 - 63 U/L Final  . Alkaline Phosphatase 02/15/2018 79  38 - 126 U/L Final  . Total Bilirubin 02/15/2018 1.4* 0.3 - 1.2 mg/dL Final  . GFR calc non Af Amer 02/15/2018 >60  >60 mL/min Final  . GFR calc Af Amer 02/15/2018 >60  >60 mL/min Final   Comment: (NOTE) The eGFR has been calculated using the CKD EPI equation. This calculation has not been validated in all clinical situations. eGFR's persistently <60 mL/min signify possible Chronic Kidney Disease.   Georgiann Hahn gap 02/15/2018 11  5 - 15 Final   Performed at [facility], [place] [address] [place], [place] [address]  . Prothrombin Time 02/15/2018 13.0  11.4 - 15.2 seconds Final  . INR 02/15/2018 0.99   Final   Performed at [facility], [place] [address], [place], [place] [address]  . ABO/RH(D) 02/15/2018 O POS   Final  . Antibody Screen 02/15/2018 NEG   Final  . Sample Expiration 02/15/2018 02/24/2018   Final  . Extend sample reason 02/15/2018    Final                   Value:NO TRANSFUSIONS OR PREGNANCY IN THE PAST 3 MONTHS Performed at [facility], [place] [address] [place], [place] [address]   . Color, Urine 02/15/2018 YELLOW  YELLOW Final  . APPearance 02/15/2018 CLEAR  CLEAR Final  . Specific Gravity, Urine 02/15/2018 1.019  1.005 - 1.030 Final  . pH 02/15/2018 5.0  5.0 - 8.0 Final  . Glucose, UA 02/15/2018 NEGATIVE  NEGATIVE mg/dL Final  . Hgb urine dipstick 02/15/2018 NEGATIVE  NEGATIVE Final  . Bilirubin Urine 02/15/2018 NEGATIVE  NEGATIVE Final  . Ketones, ur 02/15/2018 NEGATIVE  NEGATIVE mg/dL Final  . Protein, ur 02/15/2018 NEGATIVE  NEGATIVE mg/dL Final  . Nitrite 02/15/2018 NEGATIVE  NEGATIVE Final  . Leukocytes, UA 02/15/2018 NEGATIVE  NEGATIVE Final   Performed at [place] [address] [place], [place] [address]  . MRSA, PCR 02/15/2018 NEGATIVE  NEGATIVE Final  . Staphylococcus aureus 02/15/2018 NEGATIVE  NEGATIVE Final   Comment: (NOTE) The Xpert SA Assay (FDA approved for NASAL specimens in patients 64 years of age and older), is one component of a comprehensive surveillance program. It is not intended to diagnose infection nor to guide or monitor treatment. Performed at [facility], [place] [address], [place], [place] [address]   . Hgb A1c MFr Bld 02/15/2018 5.6  4.8 - 5.6 % Final   Comment: (NOTE) Pre diabetes:          5.7%-6.4% Diabetes:              >6.4% Glycemic control for   <7.0% adults with diabetes   . Mean Plasma Glucose 02/15/2018 114.02  mg/dL Final   Performed at [place] [address] [place], [place] [address]  X-Rays:Dg Chest 2 View  Result Date: 02/16/2018 CLINICAL DATA:  Preop for total knee replacement. EXAM: CHEST - 2 VIEW COMPARISON:  12/14/2017. FINDINGS: Mediastinum and hilar structures normal. Lungs are clear. No pleural effusion or pneumothorax. Cardiomegaly with normal pulmonary vascularity. Thoracic spine scoliosis and degenerative change. Surgical clips noted over the upper chest and lower neck. IMPRESSION: No acute cardiopulmonary  disease. Electronically Signed   By: Marcello Moores  Register   On: 02/16/2018 07:44    EKG: Orders placed or performed during the hospital encounter of 12/14/17  . EKG  . EKG     Hospital Course: Joshua Graves is a 67 y.o. who was admitted to [facility]. They were brought to the operating room on 02/21/2018 and underwent Procedure(s): LEFT TOTAL KNEE ARTHROPLASTY.  Patient tolerated the procedure well and was later transferred to the recovery room and then to the orthopaedic floor for postoperative care.  They were given PO and IV analgesics for pain control following their surgery.  They were given 24 hours of postoperative antibiotics of  Anti-infectives (From admission, onward)   Start     Dose/Rate Route Frequency Ordered Stop   02/21/18 1500  ceFAZolin (ANCEF) IVPB 1 g/50 mL premix     1 g 100 mL/hr over 30 Minutes Intravenous Every 6 hours 02/21/18 1221 02/21/18 2245   02/21/18 0620  ceFAZolin (ANCEF) 2-4 GM/100ML-% IVPB    Note to Pharmacy:  Waldron Session   : cabinet override      02/21/18 0620 02/21/18 0842   02/21/18 0614  ceFAZolin (ANCEF) IVPB 2g/100 mL premix     2 g 200 mL/hr over 30 Minutes Intravenous On call to O.R. 02/21/18 5397 02/21/18 0842   02/21/18 0000  polymyxin B 500,000 Units, bacitracin 50,000 Units in sodium chloride 0.9 % 500 mL irrigation  Status:  Discontinued       As needed 02/21/18 0912 02/21/18 1047     and started on DVT prophylaxis in the form of Xarelto.   PT and OT were ordered for total joint protocol.  Discharge planning consulted to help with postop disposition and equipment needs.  Patient had a good night on the evening of surgery.  They started to get up OOB with therapy on day one. Hemovac drain was pulled without difficulty.  Continued to work with therapy into day two.  Dressing was changed on day two and the incision was clean and dry. The patient had progressed with therapy and meeting their goals.  Incision was healing well.  Patient was  seen in rounds and was ready to go home.   Diet: Cardiac diet Activity:WBAT Follow-up:in 2 weeks Disposition - Home Discharged Condition: stable   Discharge Instructions    Call MD / Call 911   Complete by:  As directed    If you experience chest pain or shortness of breath, CALL 911 and be transported to the hospital emergency room.  If you develope a fever above 101 F, pus (white drainage) or increased drainage or redness at the wound, or calf pain, call your surgeon's office.   Constipation Prevention   Complete by:  As directed    Drink plenty of fluids.  Prune juice may be helpful.  You may use a stool softener, such as Colace (over the counter) 100 mg twice a day.  Use MiraLax (over the counter) for constipation as needed.   Diet - low sodium heart healthy   Complete by:  As directed    Discharge  instructions   Complete by:  As directed    INSTRUCTIONS AFTER JOINT REPLACEMENT   Remove items at home which could result in a fall. This includes throw rugs or furniture in walking pathways ICE to the affected joint every three hours while awake for 30 minutes at a time, for at least the first 3-5 days, and then as needed for pain and swelling.  Continue to use ice for pain and swelling. You may notice swelling that will progress down to the foot and ankle.  This is normal after surgery.  Elevate your leg when you are not up walking on it.   Continue to use the breathing machine you got in the hospital (incentive spirometer) which will help keep your temperature down.  It is common for your temperature to cycle up and down following surgery, especially at night when you are not up moving around and exerting yourself.  The breathing machine keeps your lungs expanded and your temperature down.   DIET:  As you were doing prior to hospitalization, we recommend a well-balanced diet.  DRESSING / WOUND CARE / SHOWERING  You may change your dressing every day with sterile gauze.  Please use  good hand washing techniques before changing the dressing.  Do not use any lotions or creams on the incision until instructed by your surgeon.  ACTIVITY  Increase activity slowly as tolerated, but follow the weight bearing instructions below.   No driving for 6 weeks or until further direction given by your physician.  You cannot drive while taking narcotics.  No lifting or carrying greater than 10 lbs. until further directed by your surgeon. Avoid periods of inactivity such as sitting longer than an hour when not asleep. This helps prevent blood clots.  You may return to work once you are authorized by your doctor.     WEIGHT BEARING   Weight bearing as tolerated with assist device (walker, cane, etc) as directed, use it as long as suggested by your surgeon or therapist, typically at least 4-6 weeks.   EXERCISES  Results after joint replacement surgery are often greatly improved when you follow the exercise, range of motion and muscle strengthening exercises prescribed by your doctor. Safety measures are also important to protect the joint from further injury. Any time any of these exercises cause you to have increased pain or swelling, decrease what you are doing until you are comfortable again and then slowly increase them. If you have problems or questions, call your caregiver or physical therapist for advice.   Rehabilitation is important following a joint replacement. After just a few days of immobilization, the muscles of the leg can become weakened and shrink (atrophy).  These exercises are designed to build up the tone and strength of the thigh and leg muscles and to improve motion. Often times heat used for twenty to thirty minutes before working out will loosen up your tissues and help with improving the range of motion but do not use heat for the first two weeks following surgery (sometimes heat can increase post-operative swelling).   These exercises can be done on a training  (exercise) mat, on the floor, on a table or on a bed. Use whatever works the best and is most comfortable for you.    Use music or television while you are exercising so that the exercises are a pleasant break in your day. This will make your life better with the exercises acting as a break in your routine that you  can look forward to.   Perform all exercises about fifteen times, three times per day or as directed.  You should exercise both the operative leg and the other leg as well.  Exercises include:   Quad Sets - Tighten up the muscle on the front of the thigh (Quad) and hold for 5-10 seconds.   Straight Leg Raises - With your knee straight (if you were given a brace, keep it on), lift the leg to 60 degrees, hold for 3 seconds, and slowly lower the leg.  Perform this exercise against resistance later as your leg gets stronger.  Leg Slides: Lying on your back, slowly slide your foot toward your buttocks, bending your knee up off the floor (only go as far as is comfortable). Then slowly slide your foot back down until your leg is flat on the floor again.  Angel Wings: Lying on your back spread your legs to the side as far apart as you can without causing discomfort.  Hamstring Strength:  Lying on your back, push your heel against the floor with your leg straight by tightening up the muscles of your buttocks.  Repeat, but this time bend your knee to a comfortable angle, and push your heel against the floor.  You may put a pillow under the heel to make it more comfortable if necessary.   A rehabilitation program following joint replacement surgery can speed recovery and prevent re-injury in the future due to weakened muscles. Contact your doctor or a physical therapist for more information on knee rehabilitation.    CONSTIPATION  Constipation is defined medically as fewer than three stools per week and severe constipation as less than one stool per week.  Even if you have a regular bowel pattern at  home, your normal regimen is likely to be disrupted due to multiple reasons following surgery.  Combination of anesthesia, postoperative narcotics, change in appetite and fluid intake all can affect your bowels.   YOU MUST use at least one of the following options; they are listed in order of increasing strength to get the job done.  They are all available over the counter, and you may need to use some, POSSIBLY even all of these options:    Drink plenty of fluids (prune juice may be helpful) and high fiber foods Colace 100 mg by mouth twice a day  Senokot for constipation as directed and as needed Dulcolax (bisacodyl), take with full glass of water  Miralax (polyethylene glycol) once or twice a day as needed.  If you have tried all these things and are unable to have a bowel movement in the first 3-4 days after surgery call either your surgeon or your primary doctor.    If you experience loose stools or diarrhea, hold the medications until you stool forms back up.  If your symptoms do not get better within 1 week or if they get worse, check with your doctor.  If you experience "the worst abdominal pain ever" or develop nausea or vomiting, please contact the office immediately for further recommendations for treatment.   ITCHING:  If you experience itching with your medications, try taking only a single pain pill, or even half a pain pill at a time.  You can also use Benadryl over the counter for itching or also to help with sleep.   TED HOSE STOCKINGS:  Use stockings on both legs until for at least 2 weeks or as directed by physician office. They may be removed at night for  sleeping.  MEDICATIONS:  See your medication summary on the "After Visit Summary" that nursing will review with you.  You may have some home medications which will be placed on hold until you complete the course of blood thinner medication.  It is important for you to complete the blood thinner medication as  prescribed.  Gabapentin 300 mg Protocol Take a 300 mg capsule three times a day for two weeks, Then a 300 mg capsule twice a day for two weeks, Then a 300 mg capsule once a day for two weeks, then discontinue the Gabapentin.   PRECAUTIONS:  If you experience chest pain or shortness of breath - call 911 immediately for transfer to the hospital emergency department.   If you develop a fever greater that 101 F, purulent drainage from wound, increased redness or drainage from wound, foul odor from the wound/dressing, or calf pain - CONTACT YOUR SURGEON.                                                   FOLLOW-UP APPOINTMENTS:  If you do not already have a post-op appointment, please call the office for an appointment to be seen by your surgeon.  Guidelines for how soon to be seen are listed in your "After Visit Summary", but are typically between 1-4 weeks after surgery.   MAKE SURE YOU:  Understand these instructions.  Get help right away if you are not doing well or get worse.    Thank you for letting us be a part of your medical care team.  It is a privilege we respect greatly.  We hope these instructions will help you stay on track for a fast and full recovery!   Increase activity slowly as tolerated   Complete by:  As directed      Allergies as of 02/23/2018   No Known Allergies     Medication List    STOP taking these medications   CALTRATE 600+D 600-400 MG-UNIT tablet Generic drug:  Calcium Carbonate-Vitamin D   ibuprofen 200 MG tablet Commonly known as:  ADVIL,MOTRIN   Vitamin D 2000 units Caps     TAKE these medications   aspirin EC 325 MG tablet Take 1 tablet (325 mg total) by mouth 2 (two) times daily. What changed:    medication strength  how much to take  when to take this   atorvastatin 40 MG tablet Commonly known as:  LIPITOR Take 40 mg by mouth at bedtime.   FISH OIL PO Take 660 mg by mouth daily.   fluticasone 50 MCG/ACT nasal spray Commonly known  as:  FLONASE Place 1 spray into both nostrils 2 (two) times daily as needed (for allergies.).   gabapentin 300 MG capsule Commonly known as:  NEURONTIN Take 1 capsule (300 mg total) by mouth 3 (three) times daily. Gabapentin 300 mg Protocol Take a 300 mg capsule three times a day for two weeks, Then a 300 mg capsule twice a day for two weeks, Then a 300 mg capsule once a day for two weeks, then discontinue the Gabapentin.   levothyroxine 200 MCG tablet Commonly known as:  SYNTHROID, LEVOTHROID Take 200 mcg by mouth at bedtime.   multivitamin with minerals Tabs tablet Take 1 tablet by mouth daily.   omeprazole 20 MG tablet Commonly known as:  PRILOSEC OTC Take 20  mg by mouth daily.      Follow-up Information    Latanya Maudlin, MD. Schedule an appointment as soon as possible for a visit in 2 week(s).   Specialty:  Orthopedic Surgery Contact information: [address] [place] [address] [phone]        [facility] Follow up.   Specialty:  [place] Why:  physical therapy Contact information: [address] [place] [address] [phone]           Signed: Ardeen Jourdain, PA-C Orthopaedic Surgery 02/25/2018, 8:19 PM

## 2018-04-17 ENCOUNTER — Other Ambulatory Visit: Payer: Self-pay | Admitting: Nurse Practitioner

## 2018-04-17 DIAGNOSIS — M545 Low back pain: Secondary | ICD-10-CM

## 2018-06-04 ENCOUNTER — Other Ambulatory Visit: Payer: Self-pay | Admitting: Urology

## 2018-06-06 ENCOUNTER — Encounter (HOSPITAL_BASED_OUTPATIENT_CLINIC_OR_DEPARTMENT_OTHER): Payer: Self-pay | Admitting: *Deleted

## 2018-06-06 ENCOUNTER — Other Ambulatory Visit: Payer: Self-pay

## 2018-06-06 NOTE — Progress Notes (Signed)
Patient to arrive at 0530. Istat 4. Needs KUB AM of surgery. EKG and CXR in chart and epic. NPO after MN. Pt to take prilosec with a sip of water AM of surgery.

## 2018-06-08 NOTE — H&P (Signed)
HPI: Joshua Graves is a 67 year-old male with a right renal stone.  He did not see the blood in his urine. The blood was first detected 04/06/2018. He has not been told that they had blood in the urine in the past.   He does have a history of smoking. He does not have a history of exposure to chemicals or fumes. He does not have a history of urinary infections. He does have a burning sensation when he urinates. There is not a history of GU malignancy in the family. There is not a history of calculus disease in the family. He is not having pain. He has not recently had unwanted weight loss.   His last U/S or CT Scan was 05/18/2018. This condition would be considered of mild to moderate severity with no modifying factors or associated signs or symptoms other than as noted above.   05/08/18: He was found to have microscopic hematuria and on 04/17/18 reported greater than 1 year history of mild dysuria. He reported no history of stones at that time. His urine had 3-10 rbc/hpf any urine culture was found to be negative. A CT scan without contrast was performed which revealed a 9 mm stone in the right renal pelvis without obstruction as well as an incidental left adrenal myelolipoma.   05/22/18: He returns today to undergo completion of his workup of microscopic hematuria having undergone a contrasted CT scan to evaluate his upper tract.  He reports he has never been told he had microscopic hematuria in the past. He has never experienced any gross hematuria. He does report some voiding symptoms consisting of some very mild dysuria on occasion. He also has intermittency as well as nocturia x3.     ALLERGIES: None   MEDICATIONS: Aspirin 81 mg tablet,chewable  Prilosec  Prilosec  Atorvastatin Calcium  Caltrate + D3 Plus Minerals  Claritin  Fish Oil  Ibuprofen  Multiple Vitamin  Synthroid  Vitamin D3     GU PSH: Locm 300-399Mg /Ml Iodine,1Ml - 05/18/2018    NON-GU PSH:  Appendectomy Thyroidectomy Tonsillectomy    GU PMH: Benign Neo Lft adrenal gland, Left, We discussed the finding of a myelolipoma of the adrenal gland is benign. - 05/08/2018 Microscopic hematuria, We discussed the need to evaluate his urothelium as this was not adequately visualized on his non contrasted CT scan. Will obtain a CT scan with contrast. He will then return for completion of his hematuria workup with cystoscopy. - 05/08/2018 Renal calculus, Right, He does have of a asymptomatic, incidentally noted right renal calculus. At this point it is certainly possible that this is the source of his microscopic hematuria however I told him I did not want to attribute his hematuria to that without further and complete evaluation for other causes of hematuria especially in someone with risk factors for urothelial carcinoma. - 05/08/2018    NON-GU PMH: Anxiety GERD Hypercholesterolemia Personal history of malignant neoplasm of thyroid Sleep Apnea    FAMILY HISTORY: None   SOCIAL HISTORY: Marital Status: Single Preferred Language: English; Ethnicity: Not Hispanic Or Latino; Race: White Current Smoking Status: Patient does not smoke anymore.   Tobacco Use Assessment Completed: Used Tobacco in last 30 days? Does drink.  Drinks 2 caffeinated drinks per day.    REVIEW OF SYSTEMS:    GU Review Male:   Patient denies frequent urination, hard to postpone urination, burning/ pain with urination, get up at night to urinate, leakage of urine, stream starts and stops, trouble starting  your stream, have to strain to urinate , erection problems, and penile pain.  Gastrointestinal (Upper):   Patient denies nausea, vomiting, and indigestion/ heartburn.  Gastrointestinal (Lower):   Patient denies diarrhea and constipation.  Constitutional:   Patient denies fever, night sweats, weight loss, and fatigue.  Skin:   Patient denies skin rash/ lesion and itching.  Eyes:   Patient denies blurred vision and  double vision.  Ears/ Nose/ Throat:   Patient denies sore throat and sinus problems.  Hematologic/Lymphatic:   Patient denies swollen glands and easy bruising.  Cardiovascular:   Patient denies leg swelling and chest pains.  Respiratory:   Patient denies cough and shortness of breath.  Endocrine:   Patient denies excessive thirst.  Musculoskeletal:   Patient denies back pain and joint pain.  Neurological:   Patient denies headaches and dizziness.  Psychologic:   Patient denies depression and anxiety.   VITAL SIGNS:    Weight 227 lb / 102.97 kg  Height 73 in / 185.42 cm  BP 145/93 mmHg  Pulse 76 /min  BMI 29.9 kg/m   GU PHYSICAL EXAMINATION:    Urethral Meatus: Normal size. No lesion, no wart, no discharge, no polyp. Normal location.  Penis: Circumcised, no warts, no cracks. No dorsal Peyronie's plaques, no left corporal Peyronie's plaques, no right corporal Peyronie's plaques, no scarring, no warts. No balanitis, no meatal stenosis.   MULTI-SYSTEM PHYSICAL EXAMINATION:    Constitutional: Well-nourished. No physical deformities. Normally developed. Good grooming.  Neck: Neck symmetrical, not swollen. Normal tracheal position.  Respiratory: No labored breathing, no use of accessory muscles. Normal breath sounds.  Cardiovascular: Normal temperature, normal extremity pulses, no swelling, no varicosities.   Lymphatic: No enlargement of neck, axillae, groin.  Skin: No paleness, no jaundice, no cyanosis. No lesion, no ulcer, no rash.  Neurologic / Psychiatric: Oriented to time, oriented to place, oriented to person. No depression, no anxiety, no agitation.  Gastrointestinal: No mass, no tenderness, no rigidity, non obese abdomen.  Eyes: Normal conjunctivae. Normal eyelids.  Ears, Nose, Mouth, and Throat: Left ear no scars, no lesions, no masses. Right ear no scars, no lesions, no masses. Nose no scars, no lesions, no masses. Normal hearing. Normal lips.  Musculoskeletal: Normal gait and  station of head and neck.         PAST DATA REVIEWED:  Source Of History:  Patient  Lab Test Review:   PSA, BUN/Creatinine  Records Review:   Previous Patient Records, POC Tool  X-Ray Review: C.T. Hematuria: Reviewed Films. Reviewed Report. Discussed With Patient. CT ABDOMEN AND PELVIS WITH CONTRAST TECHNIQUE: Multidetector CT imaging of the abdomen and pelvis was performed using the standard protocol following bolus administration of intravenous contrast. CONTRAST: 125 cc Omnipaque 300 COMPARISON: Renal stone CT 04/18/2018 FINDINGS: Lower chest: Normal heart size. Lung bases are clear. No pleural effusion. Hepatobiliary: Liver is normal in size and contour. No focal lesion identified. Gallbladder is unremarkable. No intrahepatic or extrahepatic biliary ductal dilatation. Pancreas: Unremarkable Spleen: Unremarkable Adrenals/Urinary Tract: Grossly unchanged 3.5 cm left adrenal mass containing macroscopic fat (image 23; series 2), most compatible with adrenal myelolipoma.. Right adrenal gland is unremarkable. There is a 10 mm stone within the right renal collecting system. There is a 4.2 cm partially exophytic cyst inferior pole right kidney. There is a 7.8 cm exophytic cyst off the superior pole of the left kidney. No suspicious enhancing renal masses. Adjacent stones within the interpolar left kidney measuring up to 5 mm. Stomach/Bowel: Normal morphology of  the stomach. No evidence for small bowel obstruction. No free fluid or free intraperitoneal air. Sigmoid colonic diverticulosis without evidence for acute diverticulitis. Vascular/Lymphatic: Normal caliber abdominal aorta. Peripheral calcified atherosclerotic plaque. No retroperitoneal lymphadenopathy. Reproductive: Heterogeneous prostate. Other: Small bilateral fat containing inguinal hernias. Musculoskeletal: Lumbar spine degenerative changes. No aggressive or acute appearing osseous lesions. IMPRESSION: 1. There is a 10 mm stone within the right renal  collecting system with mild pelviectasis. 2. Nonobstructing stones inferior pole left kidney.    05/08/18  PSA  Total PSA 0.96 ng/mL    05/08/18  General Chemistry  Creatinine 0.8 mg/dL   PROCEDURES:         Flexible Cystoscopy - 52000  Risks, benefits, and some of the potential complications of the procedure were discussed at length with the patient including infection, bleeding, voiding discomfort, urinary retention, fever, chills, sepsis, and others. All questions were answered. Informed consent was obtained. Sterile technique and 2% Lidocaine intraurethral analgesia were used.  Meatus:  Normal size. Normal location. Normal condition.  Urethra:  No strictures.  External Sphincter:  Normal.  Verumontanum:  Normal.  Prostate:  Non-obstructing. No hyperplasia.  Bladder Neck:  Non-obstructing.  Ureteral Orifices:  Normal location. Normal size. Normal shape. Effluxed clear urine.  Bladder:  No trabeculation. No tumors. Normal mucosa. No stones.      The lower urinary tract was carefully examined. The procedure was well-tolerated and without complications. Instructions were given to call the office immediately for bloody urine, difficulty urinating, urinary retention, painful or frequent urination, fever or other illness. The patient stated that he understood these instructions and would comply with them.   ASSESSMENT/PLAN:     ICD-10 Details  1 GU:   Microscopic hematuria - R31.21 Stable - He had no abnormality of the upper tract by CT scan with contrast. Cystoscopically there were no lesions noted within the bladder. It appears his hematuria is secondary to his renal calculi.  2   Renal calculus - N20.0 Bilateral, Stable - He was found to have a nonobstructing stone in the lower pole left kidney as well as a 10 mm stone located in the right renal pelvis with Hounsfield units of 900.  We discussed the management of urinary stones. These options include observation, ureteroscopy, shockwave  lithotripsy, and PCNL. We discussed which options are relevant to these particular stones. We discussed the natural history of stones as well as the complications of untreated stones and the impact on quality of life without treatment as well as with each of the above listed treatments. We also discussed the efficacy of each treatment in its ability to clear the stone burden. With any of these management options I discussed the signs and symptoms of infection and the need for emergent treatment should these be experienced. For each option we discussed the ability of each procedure to clear the patient of their stone burden.   For observation I described the risks which include but are not limited to silent renal damage, life-threatening infection, need for emergent surgery, failure to pass stone, and pain.   For ureteroscopy I described the risks which include heart attack, stroke, pulmonary embolus, death, bleeding, infection, damage to contiguous structures, positioning injury, ureteral stricture, ureteral avulsion, ureteral injury, need for ureteral stent, inability to perform ureteroscopy, need for an interval procedure, inability to clear stone burden, stent discomfort and pain.   For shockwave lithotripsy I described the risks which include arrhythmia, kidney contusion, kidney hemorrhage, need for transfusion, long-term risk of diabetes  or hypertension, back discomfort, flank ecchymosis, flank abrasion, inability to break up stone, inability to pass stone fragments, Steinstrasse, infection associated with obstructing stones, need for different surgical procedure and possible need for repeat shockwave lithotripsy.   After discussing the options for management he has elected proceed with ureteroscopic treatment of his right renal pelvic stone.

## 2018-06-10 ENCOUNTER — Encounter (HOSPITAL_BASED_OUTPATIENT_CLINIC_OR_DEPARTMENT_OTHER): Payer: Self-pay | Admitting: Anesthesiology

## 2018-06-10 NOTE — Anesthesia Preprocedure Evaluation (Addendum)
Anesthesia Evaluation  Patient identified by MRN, date of birth, ID band Patient awake    Reviewed: Allergy & Precautions, NPO status , Patient's Chart, lab work & pertinent test results  History of Anesthesia Complications (+) PONV and history of anesthetic complications  Airway Mallampati: I       Dental no notable dental hx. (+) Teeth Intact   Pulmonary former smoker,    Pulmonary exam normal breath sounds clear to auscultation       Cardiovascular Normal cardiovascular exam Rhythm:Regular Rate:Normal     Neuro/Psych    GI/Hepatic GERD  Medicated,  Endo/Other  Hypothyroidism   Renal/GU      Musculoskeletal   Abdominal Normal abdominal exam  (+)   Peds  Hematology   Anesthesia Other Findings   Reproductive/Obstetrics                            Anesthesia Physical Anesthesia Plan  ASA: II  Anesthesia Plan: General   Post-op Pain Management:    Induction: Intravenous  PONV Risk Score and Plan: Ondansetron and Dexamethasone  Airway Management Planned: LMA  Additional Equipment:   Intra-op Plan:   Post-operative Plan: Extubation in OR  Informed Consent: I have reviewed the patients History and Physical, chart, labs and discussed the procedure including the risks, benefits and alternatives for the proposed anesthesia with the patient or authorized representative who has indicated his/her understanding and acceptance.   Dental advisory given  Plan Discussed with: CRNA and Surgeon  Anesthesia Plan Comments:        Anesthesia Quick Evaluation                                  Anesthesia Evaluation  Patient identified by MRN, date of birth, ID band Patient awake    Reviewed: Allergy & Precautions, NPO status , Patient's Chart, lab work & pertinent test results  Airway Mallampati: III  TM Distance: >3 FB Neck ROM: Full    Dental  (+) Chipped,     Pulmonary sleep apnea and Continuous Positive Airway Pressure Ventilation , former smoker,    Pulmonary exam normal breath sounds clear to auscultation       Cardiovascular negative cardio ROS Normal cardiovascular exam Rhythm:Regular Rate:Normal  ECG: NSR, rate 89   Neuro/Psych  Headaches, PSYCHIATRIC DISORDERS Depression    GI/Hepatic Neg liver ROS, GERD  Medicated and Controlled,  Endo/Other  Hypothyroidism   Renal/GU negative Renal ROS     Musculoskeletal negative musculoskeletal ROS (+)   Abdominal (+) + obese,   Peds  Hematology HLD   Anesthesia Other Findings Left Knee Osteoarthritis  Reproductive/Obstetrics                            Anesthesia Physical Anesthesia Plan  ASA: III  Anesthesia Plan: Spinal and Regional   Post-op Pain Management:  Regional for Post-op pain   Induction:   PONV Risk Score and Plan: 1 and Ondansetron, Dexamethasone, Propofol infusion, Treatment may vary due to age or medical condition and Midazolam  Airway Management Planned: Natural Airway  Additional Equipment:   Intra-op Plan:   Post-operative Plan:   Informed Consent: I have reviewed the patients History and Physical, chart, labs and discussed the procedure including the risks, benefits and alternatives for the proposed anesthesia with the patient or authorized representative who has  indicated his/her understanding and acceptance.   Dental advisory given  Plan Discussed with: CRNA  Anesthesia Plan Comments:        Anesthesia Quick Evaluation

## 2018-06-11 ENCOUNTER — Ambulatory Visit (HOSPITAL_BASED_OUTPATIENT_CLINIC_OR_DEPARTMENT_OTHER): Payer: Medicare HMO | Admitting: Anesthesiology

## 2018-06-11 ENCOUNTER — Encounter (HOSPITAL_BASED_OUTPATIENT_CLINIC_OR_DEPARTMENT_OTHER): Payer: Self-pay | Admitting: *Deleted

## 2018-06-11 ENCOUNTER — Ambulatory Visit (HOSPITAL_COMMUNITY): Payer: Medicare HMO

## 2018-06-11 ENCOUNTER — Other Ambulatory Visit: Payer: Self-pay

## 2018-06-11 ENCOUNTER — Ambulatory Visit (HOSPITAL_BASED_OUTPATIENT_CLINIC_OR_DEPARTMENT_OTHER)
Admission: RE | Admit: 2018-06-11 | Discharge: 2018-06-11 | Disposition: A | Discharge status: Home / Self Care | Payer: Medicare HMO | Source: Ambulatory Visit | Attending: Urology | Admitting: Urology

## 2018-06-11 ENCOUNTER — Encounter (HOSPITAL_BASED_OUTPATIENT_CLINIC_OR_DEPARTMENT_OTHER)
Admission: RE | Disposition: A | Discharge status: Home / Self Care | Payer: Self-pay | Source: Ambulatory Visit | Attending: Urology

## 2018-06-11 DIAGNOSIS — Q6211 Congenital occlusion of ureteropelvic junction: Secondary | ICD-10-CM | POA: Diagnosis not present

## 2018-06-11 DIAGNOSIS — Z87891 Personal history of nicotine dependence: Secondary | ICD-10-CM | POA: Insufficient documentation

## 2018-06-11 DIAGNOSIS — E7800 Pure hypercholesterolemia, unspecified: Secondary | ICD-10-CM | POA: Insufficient documentation

## 2018-06-11 DIAGNOSIS — Z85850 Personal history of malignant neoplasm of thyroid: Secondary | ICD-10-CM | POA: Diagnosis not present

## 2018-06-11 DIAGNOSIS — Z79890 Hormone replacement therapy (postmenopausal): Secondary | ICD-10-CM | POA: Diagnosis not present

## 2018-06-11 DIAGNOSIS — Z7982 Long term (current) use of aspirin: Secondary | ICD-10-CM | POA: Diagnosis not present

## 2018-06-11 DIAGNOSIS — E890 Postprocedural hypothyroidism: Secondary | ICD-10-CM | POA: Diagnosis not present

## 2018-06-11 DIAGNOSIS — K219 Gastro-esophageal reflux disease without esophagitis: Secondary | ICD-10-CM | POA: Diagnosis not present

## 2018-06-11 DIAGNOSIS — G4730 Sleep apnea, unspecified: Secondary | ICD-10-CM | POA: Insufficient documentation

## 2018-06-11 DIAGNOSIS — Z791 Long term (current) use of non-steroidal anti-inflammatories (NSAID): Secondary | ICD-10-CM | POA: Insufficient documentation

## 2018-06-11 DIAGNOSIS — N200 Calculus of kidney: Secondary | ICD-10-CM

## 2018-06-11 DIAGNOSIS — Z79899 Other long term (current) drug therapy: Secondary | ICD-10-CM | POA: Insufficient documentation

## 2018-06-11 HISTORY — PX: URETEROSCOPY WITH HOLMIUM LASER LITHOTRIPSY: SHX6645

## 2018-06-11 HISTORY — DX: Other specified postprocedural states: Z98.890

## 2018-06-11 HISTORY — DX: Nausea with vomiting, unspecified: R11.2

## 2018-06-11 LAB — POCT I-STAT 4, (NA,K, GLUC, HGB,HCT)
Glucose, Bld: 94 mg/dL (ref 70–99)
HCT: 39 % (ref 39.0–52.0)
Hemoglobin: 13.3 g/dL (ref 13.0–17.0)
Potassium: 4 mmol/L (ref 3.5–5.1)
Sodium: 142 mmol/L (ref 135–145)

## 2018-06-11 LAB — GLUCOSE, CAPILLARY: Glucose-Capillary: 109 mg/dL — ABNORMAL HIGH (ref 70–99)

## 2018-06-11 SURGERY — URETEROSCOPY, WITH LITHOTRIPSY USING HOLMIUM LASER
Anesthesia: General | Site: Ureter | Laterality: Right

## 2018-06-11 MED ORDER — ONDANSETRON HCL 4 MG/2ML IJ SOLN
INTRAMUSCULAR | Status: AC
  Filled 2018-06-11: qty 2

## 2018-06-11 MED ORDER — EPHEDRINE 5 MG/ML INJ
INTRAVENOUS | Status: AC
  Filled 2018-06-11: qty 10

## 2018-06-11 MED ORDER — HYDROCODONE-ACETAMINOPHEN 10-325 MG PO TABS
ORAL_TABLET | ORAL | Status: AC
  Filled 2018-06-11: qty 1

## 2018-06-11 MED ORDER — HYDROCODONE-ACETAMINOPHEN 10-325 MG PO TABS
1.0000 | ORAL_TABLET | Freq: Four times a day (QID) | ORAL | Status: DC | PRN
  Administered 2018-06-11: 1 via ORAL
  Filled 2018-06-11: qty 2

## 2018-06-11 MED ORDER — LACTATED RINGERS IV SOLN
INTRAVENOUS | Status: DC
  Administered 2018-06-11: 07:00:00 via INTRAVENOUS
  Filled 2018-06-11: qty 1000

## 2018-06-11 MED ORDER — DEXAMETHASONE SODIUM PHOSPHATE 10 MG/ML IJ SOLN
INTRAMUSCULAR | Status: AC
  Filled 2018-06-11: qty 1

## 2018-06-11 MED ORDER — CIPROFLOXACIN IN D5W 400 MG/200ML IV SOLN
INTRAVENOUS | Status: AC
  Filled 2018-06-11: qty 200

## 2018-06-11 MED ORDER — HYDROCODONE-ACETAMINOPHEN 10-325 MG PO TABS: 1.0000 | ORAL_TABLET | ORAL | 0 refills | Status: DC | PRN

## 2018-06-11 MED ORDER — LIDOCAINE 2% (20 MG/ML) 5 ML SYRINGE
INTRAMUSCULAR | Status: DC | PRN
  Administered 2018-06-11: 60 mg via INTRAVENOUS

## 2018-06-11 MED ORDER — MIDAZOLAM HCL 2 MG/2ML IJ SOLN
INTRAMUSCULAR | Status: AC
  Filled 2018-06-11: qty 2

## 2018-06-11 MED ORDER — LIDOCAINE 2% (20 MG/ML) 5 ML SYRINGE
INTRAMUSCULAR | Status: AC
  Filled 2018-06-11: qty 5

## 2018-06-11 MED ORDER — PHENAZOPYRIDINE HCL 100 MG PO TABS
ORAL_TABLET | ORAL | Status: AC
Start: 2018-06-11 — End: ?
  Filled 2018-06-11: qty 2

## 2018-06-11 MED ORDER — PROPOFOL 10 MG/ML IV BOLUS
INTRAVENOUS | Status: DC | PRN
  Administered 2018-06-11: 160 mg via INTRAVENOUS

## 2018-06-11 MED ORDER — PHENAZOPYRIDINE HCL 200 MG PO TABS: 200.0000 mg | ORAL_TABLET | Freq: Three times a day (TID) | ORAL | 0 refills | Status: DC | PRN

## 2018-06-11 MED ORDER — IOHEXOL 300 MG/ML  SOLN
INTRAMUSCULAR | Status: DC | PRN
  Administered 2018-06-11: 17 mL

## 2018-06-11 MED ORDER — CIPROFLOXACIN IN D5W 400 MG/200ML IV SOLN
400.0000 mg | Freq: Once | INTRAVENOUS | Status: AC
  Administered 2018-06-11: 400 mg via INTRAVENOUS
  Filled 2018-06-11: qty 200

## 2018-06-11 MED ORDER — DEXAMETHASONE SODIUM PHOSPHATE 4 MG/ML IJ SOLN
INTRAMUSCULAR | Status: DC | PRN
  Administered 2018-06-11: 10 mg via INTRAVENOUS

## 2018-06-11 MED ORDER — EPHEDRINE SULFATE-NACL 50-0.9 MG/10ML-% IV SOSY
PREFILLED_SYRINGE | INTRAVENOUS | Status: DC | PRN
  Administered 2018-06-11 (×2): 5 mg via INTRAVENOUS

## 2018-06-11 MED ORDER — FENTANYL CITRATE (PF) 100 MCG/2ML IJ SOLN
INTRAMUSCULAR | Status: AC
  Filled 2018-06-11: qty 2

## 2018-06-11 MED ORDER — SODIUM CHLORIDE 0.9 % IR SOLN
Status: DC | PRN
  Administered 2018-06-11: 3000 mL

## 2018-06-11 MED ORDER — FENTANYL CITRATE (PF) 100 MCG/2ML IJ SOLN
INTRAMUSCULAR | Status: DC | PRN
  Administered 2018-06-11 (×2): 50 ug via INTRAVENOUS

## 2018-06-11 MED ORDER — PHENAZOPYRIDINE HCL 200 MG PO TABS
200.0000 mg | ORAL_TABLET | Freq: Three times a day (TID) | ORAL | Status: AC
  Administered 2018-06-11: 200 mg via ORAL
  Filled 2018-06-11: qty 1

## 2018-06-11 MED ORDER — ONDANSETRON HCL 4 MG/2ML IJ SOLN
INTRAMUSCULAR | Status: DC | PRN
  Administered 2018-06-11: 4 mg via INTRAVENOUS

## 2018-06-11 SURGICAL SUPPLY — 30 items
BAG DRAIN URO-CYSTO SKYTR STRL (DRAIN) ×2 IMPLANT
BAG DRN UROCATH (DRAIN) ×1
BASKET ZERO TIP NITINOL 2.4FR (BASKET) ×1 IMPLANT
BSKT STON RTRVL ZERO TP 2.4FR (BASKET) ×1
CATH INTERMIT  6FR 70CM (CATHETERS) ×1 IMPLANT
CATH URET 5FR 28IN CONE TIP (BALLOONS)
CATH URET 5FR 70CM CONE TIP (BALLOONS) IMPLANT
CLOTH BEACON ORANGE TIMEOUT ST (SAFETY) ×2 IMPLANT
EXTRACTOR STONE 1.7FRX115CM (UROLOGICAL SUPPLIES) IMPLANT
FIBER LASER FLEXIVA 365 (UROLOGICAL SUPPLIES) ×1 IMPLANT
FIBER LASER TRAC TIP (UROLOGICAL SUPPLIES) IMPLANT
GLOVE BIO SURGEON STRL SZ8 (GLOVE) ×2 IMPLANT
GLOVE BIOGEL PI IND STRL 6.5 (GLOVE) IMPLANT
GLOVE BIOGEL PI INDICATOR 6.5 (GLOVE) ×2
GOWN STRL REUS W/TWL XL LVL3 (GOWN DISPOSABLE) ×3 IMPLANT
GUIDEWIRE 0.038 PTFE COATED (WIRE) IMPLANT
GUIDEWIRE ANG ZIPWIRE 038X150 (WIRE) IMPLANT
GUIDEWIRE STR DUAL SENSOR (WIRE) ×2 IMPLANT
IV NS IRRIG 3000ML ARTHROMATIC (IV SOLUTION) ×3 IMPLANT
KIT BALLN UROMAX 15FX4 (MISCELLANEOUS) IMPLANT
KIT BALLN UROMAX 26 75X4 (MISCELLANEOUS) ×1
KIT TURNOVER CYSTO (KITS) ×2 IMPLANT
MANIFOLD NEPTUNE II (INSTRUMENTS) ×1 IMPLANT
NS IRRIG 500ML POUR BTL (IV SOLUTION) ×1 IMPLANT
PACK CYSTO (CUSTOM PROCEDURE TRAY) ×2 IMPLANT
SHEATH ACCESS URETERAL 54CM (SHEATH) ×1 IMPLANT
SHEATH URETERAL 12FRX35CM (MISCELLANEOUS) ×1 IMPLANT
STENT POLARIS LOOP 6FR X 28 CM (STENTS) ×1 IMPLANT
TUBE CONNECTING 12X1/4 (SUCTIONS) ×1 IMPLANT
TUBING UROLOGY SET (TUBING) ×1 IMPLANT

## 2018-06-11 NOTE — Anesthesia Postprocedure Evaluation (Signed)
Anesthesia Post Note  Patient: Joshua Graves  Procedure(s) Performed: URETEROSCOPY WITH HOLMIUM LASER LITHOTRIPSY/ STENT PLACEMENT (Right Ureter)     Patient location during evaluation: PACU Anesthesia Type: General Level of consciousness: awake Pain management: pain level controlled Vital Signs Assessment: post-procedure vital signs reviewed and stable Respiratory status: spontaneous breathing Cardiovascular status: stable Postop Assessment: no apparent nausea or vomiting Anesthetic complications: no    Last Vitals:  Vitals:   06/11/18 0900 06/11/18 0915  BP: 109/75 119/72  Pulse: 72 70  Resp: 20 (!) 21  Temp:    SpO2: 100% 95%    Last Pain:  Vitals:   06/11/18 0915  TempSrc:   PainSc: 0-No pain   Pain Goal: Patients Stated Pain Goal: 6 (06/11/18 0552)               [name],[name] Mateo Flow

## 2018-06-11 NOTE — Transfer of Care (Signed)
Immediate Anesthesia Transfer of Care Note  Patient: Joshua Graves  Procedure(s) Performed: URETEROSCOPY WITH HOLMIUM LASER LITHOTRIPSY/ STENT PLACEMENT (Right Ureter)  Patient Location: PACU  Anesthesia Type:General  Level of Consciousness: awake  Airway & Oxygen Therapy: Patient Spontanous Breathing and Patient connected to face mask oxygen  Post-op Assessment: Report given to RN and Post -op Vital signs reviewed and stable  Post vital signs: Reviewed and stable  Last Vitals:  Vitals Value Taken Time  BP    Temp 36.3 C 06/11/2018  8:46 AM  Pulse 72 06/11/2018  8:47 AM  Resp 20 06/11/2018  8:47 AM  SpO2 98 % 06/11/2018  8:47 AM  Vitals shown include unvalidated device data.  Last Pain:  Vitals:   06/11/18 0531  TempSrc: Oral      Patients Stated Pain Goal: 6 (66/81/59 4707)  Complications: No apparent anesthesia complications

## 2018-06-11 NOTE — Anesthesia Procedure Notes (Signed)
Procedure Name: LMA Insertion Date/Time: 06/11/2018 7:35 AM Performed by: Lieutenant Diego, CRNA Pre-anesthesia Checklist: Patient identified, Emergency Drugs available, Suction available and Patient being monitored Patient Re-evaluated:Patient Re-evaluated prior to induction Oxygen Delivery Method: Circle system utilized Preoxygenation: Pre-oxygenation with 100% oxygen Induction Type: IV induction Ventilation: Mask ventilation without difficulty LMA: LMA inserted LMA Size: 5.0 Number of attempts: 1 Airway Equipment and Method: Bite block Placement Confirmation: positive ETCO2 and breath sounds checked- equal and bilateral Tube secured with: Tape Dental Injury: Teeth and Oropharynx as per pre-operative assessment

## 2018-06-11 NOTE — Discharge Instructions (Signed)

## 2018-06-11 NOTE — Op Note (Addendum)
PATIENT:  Joshua Graves  PRE-OPERATIVE DIAGNOSIS:  right renal calculus  POST-OPERATIVE DIAGNOSIS: 1.  Right renal calculus 2.  Right UPJ stenosis  PROCEDURE:  1. Cystoscopy with right retrograde pyelogram including interpretation. 2. Right ureteroscopy, laser lithotripsy, stone extraction and stent placement. 3. Dilation of right UPJ stenosis. 4. Fluoroscopy time less than 1 hour  SURGEON: Claybon Jabs, MD  INDICATION: Joshua Graves is a 67 year old male who experienced hematuria and his work-up included a CT scan which revealed a stone located in the renal pelvis on the right-hand side measuring 9 mm.  There was a stone in the lower pole of the left kidney not causing obstruction.  I noted no significant dilation of the collecting system or blunting of the calyces.  Cystoscopy revealed no abnormality of the bladder and therefore he has elected to proceed with ureteroscopic management of his right renal pelvic stone.  ANESTHESIA:  General  EBL:  Minimal  DRAINS: 6 French, 28 cm Polaris stent (with string)  SPECIMEN: Stone taken for compositional analysis  DESCRIPTION OF PROCEDURE: The patient was taken to the major OR and placed on the table. General anesthesia was administered and then the patient was moved to the dorsal lithotomy position. The genitalia was sterilely prepped and draped. An official timeout was performed.  Initially the 26 French cystoscope with 30 lens was passed under direct vision into the bladder. The bladder was then fully inspected. It was noted be free of any tumors, stones or inflammatory lesions. Ureteral orifices were of normal configuration and position. A 6 French open-ended ureteral catheter was then passed through the cystoscope into the ureteral orifice in order to perform a right retrograde pyelogram.  A retrograde pyelogram was performed by injecting full-strength contrast up the right ureter under direct fluoroscopic control. It revealed a filling  defect in the renal pelvis consistent with the stone seen on the preoperative KUB. The remainder of the ureter was noted to be normal as was the intrarenal collecting system.  I did however note there appeared to be fullness of the renal pelvis after I injected the contrast that did not appear to drain out as swiftly as it should have with the impression of a possible UPJ obstruction.   I then passed a 0.038 inch floppy-tipped guidewire through the open ended catheter and into the area of the renal pelvis and this was left in place. The inner portion of a ureteral access sheath was then passed over the guidewire to gently dilate the intramural ureter.  Next I passed the ureteral access sheath with obturator up the ureter and removed the guidewire and obturator.  I then proceeded with ureteroscopy.  A 6 [place] digital, flexible ureteroscope was then passed through the access sheath and up the ureter.  I inspected the ureteropelvic junction and noted that it would allow passage of the 6 [place] scope but was clearly narrowed.  I therefore passed the guidewire back through this area and used ureteral dilating balloon to dilate the UPJ region as I felt this may be contributing to his risk of stone formation.   I then reinserted the ureteroscope and passed this into the renal pelvis.  The stone was identified and I felt it was too large to extract and therefore elected to proceed with laser lithotripsy. The 365  holmium laser fiber was used to fragment the stone. I then used the nitinol basket to extract two small portions of stone to be sent for compositional analysis.  I then  proceeded to fully dust the stone leaving fragments less than 1 mm.  I inspected the upper, middle and lower pole calyces and noted no stone fragments larger than 1 mm.  I then passed the guidewire through the access sheath and removed the access sheath leaving the guidewire in place.  Then I backloaded the cystoscope over the guidewire and  passed the stent over the guidewire into the area of the renal pelvis. As the guidewire was removed good curl was noted in the renal pelvis. The bladder was drained and the cystoscope was then removed. The patient tolerated the procedure well no intraoperative complications.  PLAN OF CARE: Discharge to home after PACU  PATIENT DISPOSITION:  PACU - hemodynamically stable.

## 2018-06-12 ENCOUNTER — Encounter (HOSPITAL_BASED_OUTPATIENT_CLINIC_OR_DEPARTMENT_OTHER): Payer: Self-pay | Admitting: Urology

## 2018-09-04 IMAGING — DX DG CHEST 2V
2 series · 2 of 2 positions shown · non-contrast
Comparison: 12/14/2017.

CLINICAL DATA: Preop for total knee replacement.

EXAM:
CHEST - 2 VIEW

[chest pa]
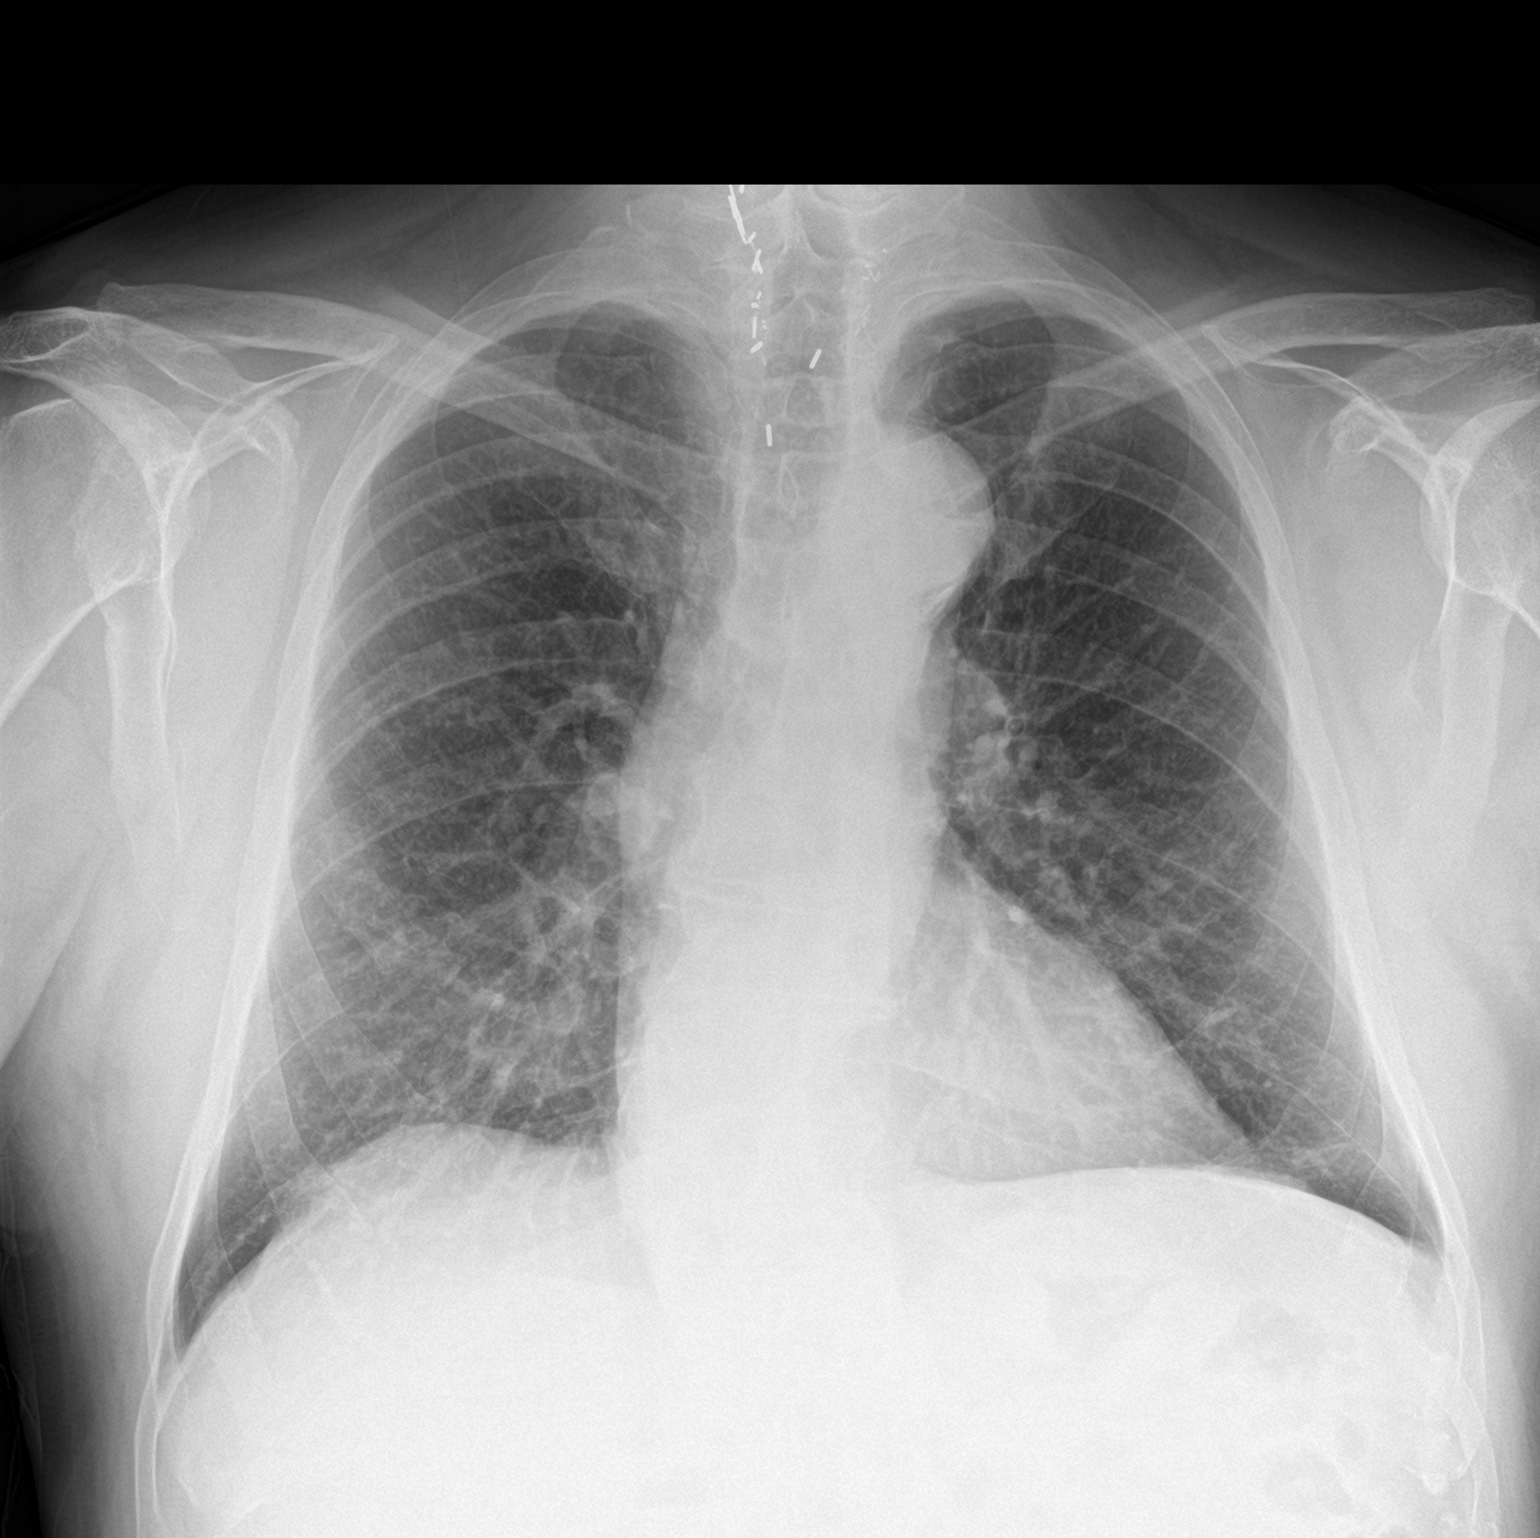

[chest lat]
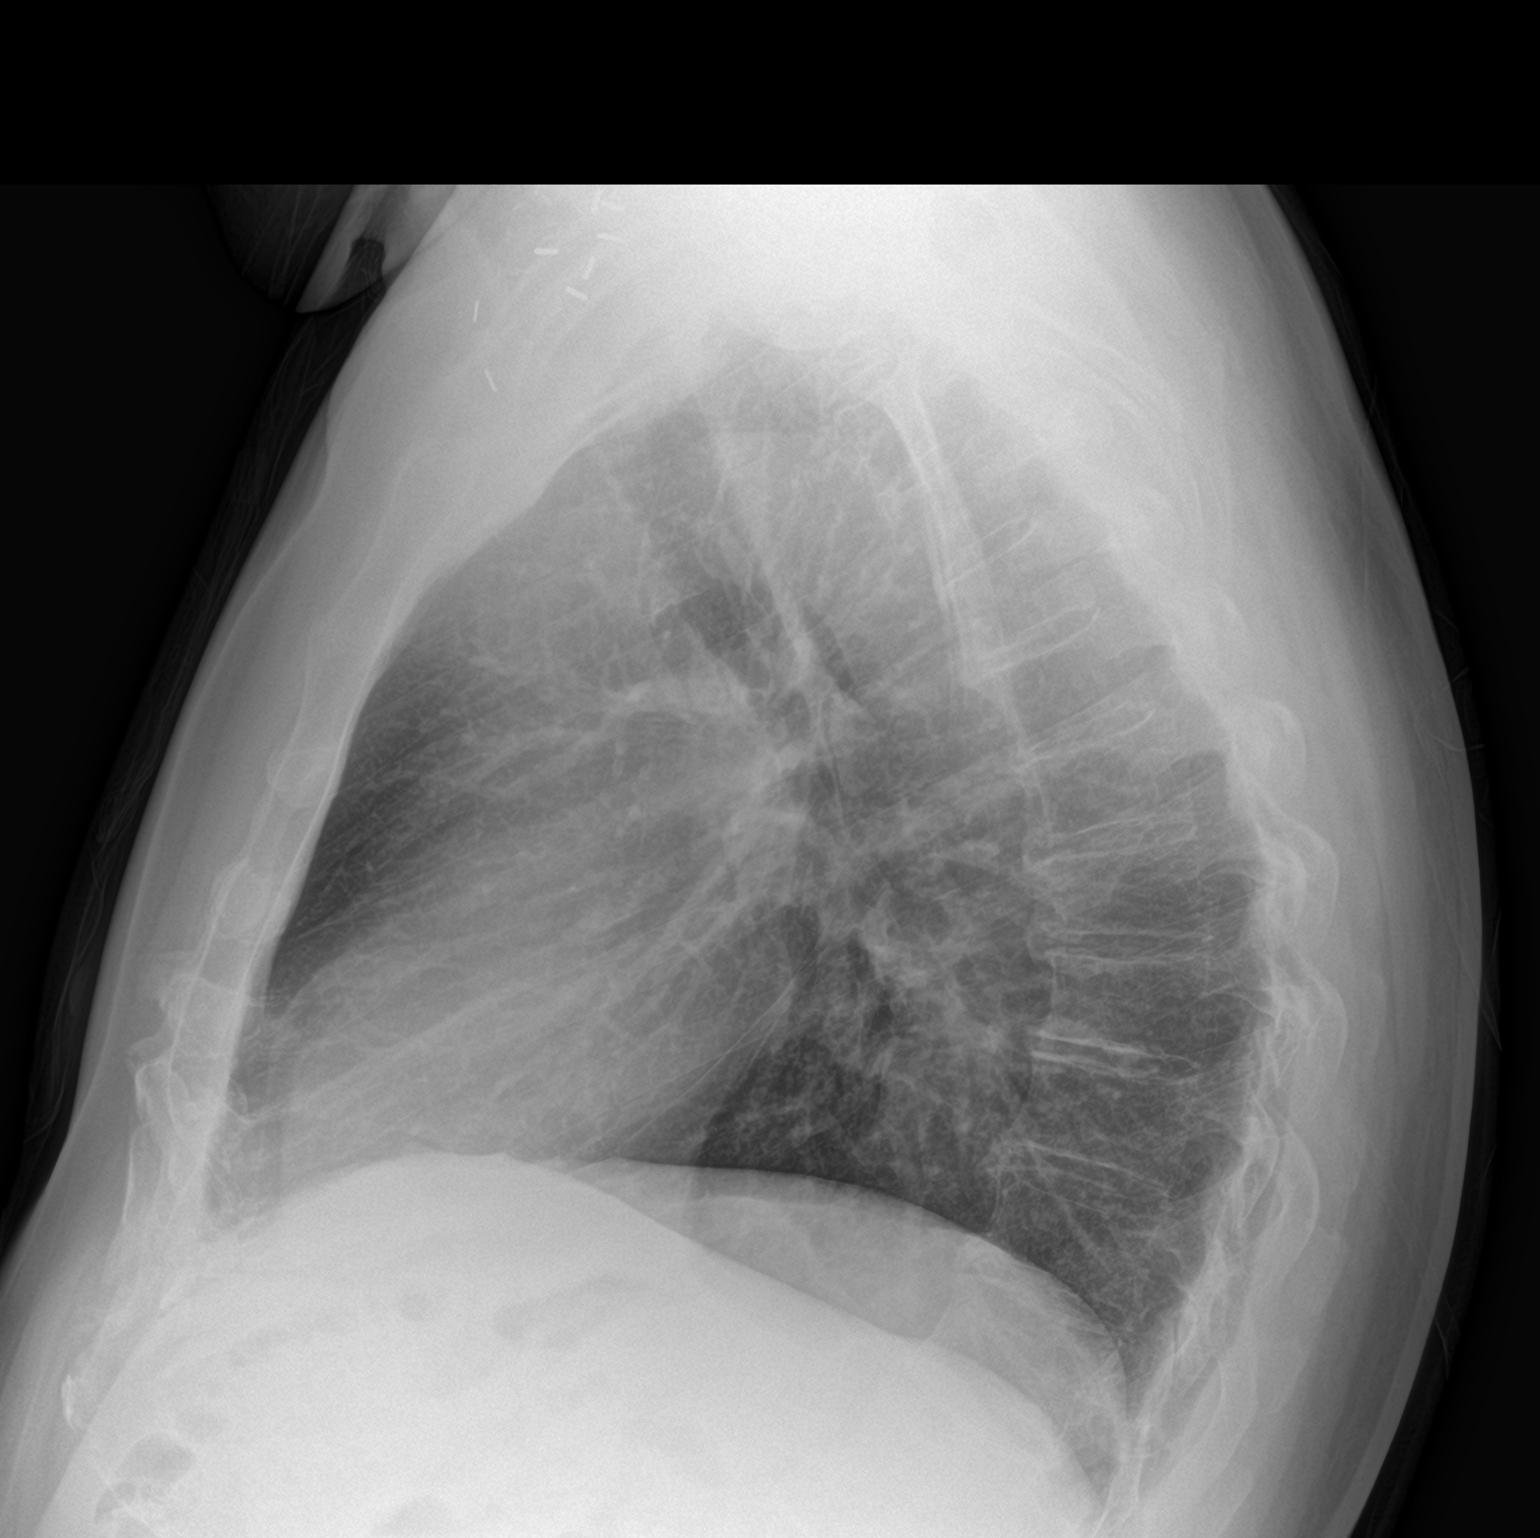

[2 of 2 positions shown; findings below may reference images not displayed]

FINDINGS: Mediastinum and hilar structures normal. Lungs are clear. No pleural
effusion or pneumothorax. Cardiomegaly with normal pulmonary
vascularity. Thoracic spine scoliosis and degenerative change.
Surgical clips noted over the upper chest and lower neck.
IMPRESSION: No acute cardiopulmonary disease.

## 2019-03-25 ENCOUNTER — Ambulatory Visit (HOSPITAL_COMMUNITY)
Admission: RE | Admit: 2019-03-25 | Discharge: 2019-03-25 | Disposition: A | Discharge status: Home / Self Care | Payer: Medicare HMO | Source: Ambulatory Visit | Attending: Cardiovascular Disease | Admitting: Cardiovascular Disease

## 2019-03-25 ENCOUNTER — Other Ambulatory Visit (HOSPITAL_COMMUNITY): Payer: Self-pay | Admitting: Orthopedic Surgery

## 2019-03-25 ENCOUNTER — Other Ambulatory Visit: Payer: Self-pay

## 2019-03-25 DIAGNOSIS — M79661 Pain in right lower leg: Secondary | ICD-10-CM | POA: Diagnosis not present

## 2019-03-25 DIAGNOSIS — M7989 Other specified soft tissue disorders: Secondary | ICD-10-CM | POA: Diagnosis not present

## 2019-03-25 DIAGNOSIS — M79662 Pain in left lower leg: Secondary | ICD-10-CM | POA: Diagnosis not present

## 2021-04-27 ENCOUNTER — Ambulatory Visit: Payer: Self-pay | Admitting: General Surgery

## 2021-04-27 DIAGNOSIS — K429 Umbilical hernia without obstruction or gangrene: Secondary | ICD-10-CM | POA: Diagnosis not present

## 2021-04-27 NOTE — H&P (Signed)
REFERRING PHYSICIAN:  Self   PROVIDER:  Ralene Ok, MD   MRN: [id number] DOB: 12/23/1950 DATE OF ENCOUNTER: 04/27/2021   Subjective    Chief Complaint: Hernia (Umbilical hernia/)       History of Present Illness: Joshua Graves is a 70 y.o. male who is seen today as an office consultation at the request offor evaluation of Hernia (Umbilical hernia/)     Patient is a 70 year old male, comes in secondary to a umbilical hernia. Patient has a history of hypothyroidism, hypercholesterolemia, and previous knee surgery. Patient states that he has had a hernia in his umbilicus approximately 2-5/6 to 3 years.  This was after his knee surgery.  Patient states that he does have some pain and discomfort.  Patient still works and does have some discomfort at times with lifting.  He states use of back brace.   Patient had no previous abdominal surgery.     Review of Systems: A complete review of systems was obtained from the patient.  I have reviewed this information and discussed as appropriate with the patient.  See HPI as well for other ROS.   Review of Systems  Constitutional: Negative.   HENT: Negative.   Eyes: Negative.   Respiratory: Negative.   Cardiovascular: Negative.   Gastrointestinal: Negative.   Genitourinary: Negative.   Musculoskeletal: Negative.   Skin: Negative.   Neurological: Negative.   Endo/Heme/Allergies: Negative.   Psychiatric/Behavioral: Negative.   All other systems reviewed and are negative.       Medical History: Past Medical History      Past Medical History:  Diagnosis Date   Arthritis     History of cancer     Sleep apnea             Patient Active Problem List  Diagnosis   Hearing loss   Nasal congestion   Sore throat, unspecified   Eye pain, unspecified laterality   Leg swelling   SOB (shortness of breath)   Heartburn   Easy bruising   Headache   History of depression   Anxiety      Past Surgical History        Past Surgical History:  Procedure Laterality Date   APPENDECTOMY       REPLACEMENT TOTAL KNEE       TONSILLECTOMY N/A          Allergies       Allergies  Allergen Reactions   Other Nausea And Vomiting      Anesthesia caused nausea and vomiting and diarrhea              Current Outpatient Medications on File Prior to Visit  Medication Sig Dispense Refill   acetaminophen (TYLENOL) 650 MG ER tablet Take by mouth       aspirin 81 MG EC tablet Take 1 tablet by mouth once daily       atorvastatin (LIPITOR) 40 MG tablet atorvastatin 40 mg tablet  TAKE 1 TABLET BY MOUTH EVERY DAY FOR CHOLESTEROL       calcium carbonate-vitamin D3 (CALTRATE 600+D) 600 mg-10 mcg (400 unit) tablet Take 1 tablet with food twice a day       fluticasone propionate (FLONASE) 50 mcg/actuation nasal spray fluticasone propionate 50 mcg/actuation nasal spray,suspension   1 spray by nasal route.       levothyroxine (SYNTHROID) 200 MCG tablet Synthroid 200 mcg tablet  TAKE 1 TABLET BY MOUTH EVERY DAY IN THE MORNING  ON EMPTY STOMACH FOR THYROID       loratadine (CLARITIN) 10 mg tablet daily       meloxicam (MOBIC) 15 MG tablet meloxicam 15 mg tablet  TAKE 1 TABLET BY MOUTH EVERY DAY       multivitamin tablet Take 1 tablet by mouth once daily       omega-3 fatty acids-fish oil 300-1,000 mg capsule Take 1 capsule by mouth once daily       omeprazole (PRILOSEC OTC) 20 MG EC tablet omeprazole magnesium 20 mg tablet,delayed release   20 mg by oral route.        No current facility-administered medications on file prior to visit.      Family History       Family History  Problem Relation Age of Onset   Colon cancer Father          Social History       Tobacco Use  Smoking Status Never Smoker  Smokeless Tobacco Never Used      Social History  Social History        Socioeconomic History   Marital status: Married  Tobacco Use   Smoking status: Never Smoker   Smokeless tobacco: Never Used  IT trainer Use: Never used  Substance and Sexual Activity   Alcohol use: Never   Drug use: Never   Sexual activity: Defer        Objective:         Vitals:    04/27/21 1454  BP: (!) 190/100  Pulse: (!) 163  Temp: 37.1 C (98.7 F)  SpO2: 97%  Weight: (!) 125.8 kg (277 lb 6.4 oz)  Height: 185.4 cm (6\' 1" )    Body mass index is 36.6 kg/m.   Physical Exam Constitutional:      Appearance: Normal appearance.  HENT:     Head: Normocephalic and atraumatic.     Nose: Nose normal.     Mouth/Throat:     Mouth: Mucous membranes are moist.     Pharynx: Oropharynx is clear.  Eyes:     Extraocular Movements: Extraocular movements intact.     Pupils: Pupils are equal, round, and reactive to light.  Cardiovascular:     Rate and Rhythm: Normal rate and regular rhythm.     Pulses: Normal pulses.  Pulmonary:     Effort: Pulmonary effort is normal.     Breath sounds: Normal breath sounds.  Abdominal:     General: Abdomen is flat.     Hernia: A hernia is present. Hernia is present in the umbilical area.  Skin:    General: Skin is warm and dry.  Neurological:     Mental Status: He is alert.  Psychiatric:        Mood and Affect: Mood normal.        Behavior: Behavior normal.        Thought Content: Thought content normal.              Labs, Imaging and Diagnostic Testing: None   Assessment and Plan:  Diagnoses and all orders for this visit:   Umbilical hernia without obstruction or gangrene -     [facility] Bariatric Case Posting Request; Future       Patient is a 70 year old male with a primary umb hernia. 1.  Patient like to proceed to the operating for laparoscopic umbilical hernia repair with mesh 2.  All risks and benefits were discussed with the patient, to  generally include infection, bleeding, damage to surrounding structures, acute and chronic nerve pain, and recurrence. Alternatives were offered and described.  All questions were answered and the patient voiced  understanding of the procedure and wishes to proceed at this point.

## 2021-05-04 DIAGNOSIS — M25572 Pain in left ankle and joints of left foot: Secondary | ICD-10-CM | POA: Diagnosis not present

## 2021-05-04 DIAGNOSIS — M25561 Pain in right knee: Secondary | ICD-10-CM | POA: Diagnosis not present

## 2021-05-14 DIAGNOSIS — E782 Mixed hyperlipidemia: Secondary | ICD-10-CM | POA: Diagnosis not present

## 2021-05-14 DIAGNOSIS — R7301 Impaired fasting glucose: Secondary | ICD-10-CM | POA: Diagnosis not present

## 2021-05-14 DIAGNOSIS — E890 Postprocedural hypothyroidism: Secondary | ICD-10-CM | POA: Diagnosis not present

## 2021-05-21 DIAGNOSIS — M8580 Other specified disorders of bone density and structure, unspecified site: Secondary | ICD-10-CM | POA: Diagnosis not present

## 2021-05-21 DIAGNOSIS — E890 Postprocedural hypothyroidism: Secondary | ICD-10-CM | POA: Diagnosis not present

## 2021-05-21 DIAGNOSIS — E782 Mixed hyperlipidemia: Secondary | ICD-10-CM | POA: Diagnosis not present

## 2021-05-21 DIAGNOSIS — Z85850 Personal history of malignant neoplasm of thyroid: Secondary | ICD-10-CM | POA: Diagnosis not present

## 2021-05-21 DIAGNOSIS — K219 Gastro-esophageal reflux disease without esophagitis: Secondary | ICD-10-CM | POA: Diagnosis not present

## 2021-05-21 DIAGNOSIS — R7301 Impaired fasting glucose: Secondary | ICD-10-CM | POA: Diagnosis not present

## 2021-05-31 NOTE — Progress Notes (Signed)
Surgical Instructions    Your procedure is scheduled on 06/09/21.  Report to [facility] Main Entrance "A" at 6:30 A.M., then check in with the Admitting office.  Call this number if you have problems the morning of surgery:  [phone]   If you have any questions prior to your surgery date call [phone]: Open Monday-Friday 8am-4pm    Remember:  Do not eat after midnight the night before your surgery  You may drink clear liquids until 5:30 the morning of your surgery.   Clear liquids allowed are: Water, Non-Citrus Juices (without pulp), Carbonated Beverages, Clear Tea, Black Coffee Only, and Gatorade    Take these medicines the morning of surgery with A SIP OF WATER: atorvastatin (LIPITOR)  loratadine (CLARITIN)   As Needed: acetaminophen (TYLENOL) carboxymethylcellulose (REFRESH PLUS) famotidine (PEPCID)  oxymetazoline (AFRIN)   As of today, STOP taking any Aspirin (unless otherwise instructed by your surgeon) Meloxicam, Aleve, Naproxen, Ibuprofen, Motrin, Advil, Goody's, BC's, all herbal medications, fish oil, and all vitamins.          Do not wear jewelry  Do not wear lotions, powders, colognes, or deodorant. Do not shave 48 hours prior to surgery.  Men may shave face and neck. Do not bring valuables to the hospital.              [facility] is not responsible for any belongings or valuables.  Do NOT Smoke (Tobacco/Vaping) or drink Alcohol 24 hours prior to your procedure If you use a CPAP at night, you may bring all equipment for your overnight stay.   Contacts, glasses, dentures or bridgework may not be worn into surgery, please bring cases for these belongings   For patients admitted to the hospital, discharge time will be determined by your treatment team.   Patients discharged the day of surgery will not be allowed to drive home, and someone needs to stay with them for 24 hours.  ONLY 1 SUPPORT PERSON MAY BE PRESENT WHILE YOU ARE IN SURGERY. IF YOU ARE TO BE  ADMITTED ONCE YOU ARE IN YOUR ROOM YOU WILL BE ALLOWED TWO (2) VISITORS.  Minor children may have two parents present. Special consideration for safety and communication needs will be reviewed on a case by case basis.  Special instructions:    Oral Hygiene is also important to reduce your risk of infection.  Remember - BRUSH YOUR TEETH THE MORNING OF SURGERY WITH YOUR REGULAR TOOTHPASTE   [place]- Preparing For Surgery  Before surgery, you can play an important role. Because skin is not sterile, your skin needs to be as free of germs as possible. You can reduce the number of germs on your skin by washing with CHG (chlorahexidine gluconate) Soap before surgery.  CHG is an antiseptic cleaner which kills germs and bonds with the skin to continue killing germs even after washing.     Please do not use if you have an allergy to CHG or antibacterial soaps. If your skin becomes reddened/irritated stop using the CHG.  Do not shave (including legs and underarms) for at least 48 hours prior to first CHG shower. It is OK to shave your face.  Please follow these instructions carefully.     Shower the NIGHT BEFORE SURGERY and the MORNING OF SURGERY with CHG Soap.   If you chose to wash your hair, wash your hair first as usual with your normal shampoo. After you shampoo, rinse your hair and body thoroughly to remove the shampoo.  Then  Wash Face and genitals (private parts) with your normal soap and rinse thoroughly to remove soap.  After that Use CHG Soap as you would any other liquid soap. You can apply CHG directly to the skin and wash gently with a scrungie or a clean washcloth.   Apply the CHG Soap to your body ONLY FROM THE NECK DOWN.  Do not use on open wounds or open sores. Avoid contact with your eyes, ears, mouth and genitals (private parts). Wash Face and genitals (private parts)  with your normal soap.   Wash thoroughly, paying special attention to the area where your surgery will be  performed.  Thoroughly rinse your body with warm water from the neck down.  DO NOT shower/wash with your normal soap after using and rinsing off the CHG Soap.  Pat yourself dry with a CLEAN TOWEL.  Wear CLEAN PAJAMAS to bed the night before surgery  Place CLEAN SHEETS on your bed the night before your surgery  DO NOT SLEEP WITH PETS.   Day of Surgery:  Take a shower with CHG soap. Wear Clean/Comfortable clothing the morning of surgery Do not apply any deodorants/lotions.   Remember to brush your teeth WITH YOUR REGULAR TOOTHPASTE.   Please read over the following fact sheets that you were given.

## 2021-06-01 ENCOUNTER — Encounter (HOSPITAL_COMMUNITY): Payer: Self-pay

## 2021-06-01 ENCOUNTER — Encounter (HOSPITAL_COMMUNITY)
Admission: RE | Admit: 2021-06-01 | Discharge: 2021-06-01 | Disposition: A | Discharge status: Home / Self Care | Payer: Medicare HMO | Source: Ambulatory Visit | Attending: General Surgery | Admitting: General Surgery

## 2021-06-01 ENCOUNTER — Other Ambulatory Visit: Payer: Self-pay

## 2021-06-01 DIAGNOSIS — Z01812 Encounter for preprocedural laboratory examination: Secondary | ICD-10-CM | POA: Insufficient documentation

## 2021-06-01 HISTORY — DX: Personal history of urinary calculi: Z87.442

## 2021-06-01 LAB — CBC
HCT: 46 % (ref 39.0–52.0)
Hemoglobin: 14.8 g/dL (ref 13.0–17.0)
MCH: 31.3 pg (ref 26.0–34.0)
MCHC: 32.2 g/dL (ref 30.0–36.0)
MCV: 97.3 fL (ref 80.0–100.0)
Platelets: 229 10*3/uL (ref 150–400)
RBC: 4.73 MIL/uL (ref 4.22–5.81)
RDW: 13.8 % (ref 11.5–15.5)
WBC: 9.5 10*3/uL (ref 4.0–10.5)
nRBC: 0 % (ref 0.0–0.2)

## 2021-06-01 NOTE — Progress Notes (Signed)
PCP: Leonard Downing, MD Cardiologist: Denies  EKG: 12/14/17 CXR: 02/15/18 ECHO: Denies Stress Test: Denies Cardiac Cath: Denies  Fasting Blood Sugar- pre-diabetic.  Does not check Checks Blood Sugar_0__ times a day  OSA/CPAP: Yes, wears nightly  ASA: Patient will call MD for instructions Blood Thinner: No  Covid test not needed  Anesthesia Review: No  Patient denies shortness of breath, fever, cough, and chest pain at PAT appointment.  Patient verbalized understanding of instructions provided today at the PAT appointment.  Patient asked to review instructions at home and day of surgery.

## 2021-06-08 NOTE — H&P (Signed)
REFERRING PHYSICIAN:  Self   PROVIDER:  Ralene Ok, MD   MRN: [id number] DOB: 07-23-1951 DATE OF ENCOUNTER: 04/27/2021   Subjective   Chief Complaint: Hernia (Umbilical hernia/)       History of Present Illness: Joshua Graves is a 70 y.o. male who is seen today as an office consultation at the request offor evaluation of Hernia (Umbilical hernia/)     Patient is a 70 year old male, comes in secondary to a umbilical hernia. Patient has a history of hypothyroidism, hypercholesterolemia, and previous knee surgery. Patient states that he has had a hernia in his umbilicus approximately 123456 to 3 years.  This was after his knee surgery.  Patient states that he does have some pain and discomfort.  Patient still works and does have some discomfort at times with lifting.  He states use of back brace.   Patient had no previous abdominal surgery.     Review of Systems: A complete review of systems was obtained from the patient.  I have reviewed this information and discussed as appropriate with the patient.  See HPI as well for other ROS.   Review of Systems  Constitutional: Negative.   HENT: Negative.   Eyes: Negative.   Respiratory: Negative.   Cardiovascular: Negative.   Gastrointestinal: Negative.   Genitourinary: Negative.   Musculoskeletal: Negative.   Skin: Negative.   Neurological: Negative.   Endo/Heme/Allergies: Negative.   Psychiatric/Behavioral: Negative.   All other systems reviewed and are negative.       Medical History: Past Medical History       Past Medical History: Diagnosis Date  Arthritis    History of cancer    Sleep apnea            Patient Active Problem List Diagnosis  Hearing loss  Nasal congestion  Sore throat, unspecified  Eye pain, unspecified laterality  Leg swelling  SOB (shortness of breath)  Heartburn  Easy bruising  Headache  History of depression  Anxiety     Past Surgical History         Past Surgical  History: Procedure Laterality Date  APPENDECTOMY      REPLACEMENT TOTAL KNEE      TONSILLECTOMY N/A        Allergies         Allergies Allergen Reactions  Other Nausea And Vomiting     Anesthesia caused nausea and vomiting and diarrhea                Current Outpatient Medications on File Prior to Visit Medication Sig Dispense Refill  acetaminophen (TYLENOL) 650 MG ER tablet Take by mouth      aspirin 81 MG EC tablet Take 1 tablet by mouth once daily      atorvastatin (LIPITOR) 40 MG tablet atorvastatin 40 mg tablet  TAKE 1 TABLET BY MOUTH EVERY DAY FOR CHOLESTEROL      calcium carbonate-vitamin D3 (CALTRATE 600+D) 600 mg-10 mcg (400 unit) tablet Take 1 tablet with food twice a day      fluticasone propionate (FLONASE) 50 mcg/actuation nasal spray fluticasone propionate 50 mcg/actuation nasal spray,suspension   1 spray by nasal route.      levothyroxine (SYNTHROID) 200 MCG tablet Synthroid 200 mcg tablet  TAKE 1 TABLET BY MOUTH EVERY DAY IN THE MORNING ON EMPTY STOMACH FOR THYROID      loratadine (CLARITIN) 10 mg tablet daily      meloxicam (MOBIC) 15 MG tablet meloxicam 15 mg tablet  TAKE 1  TABLET BY MOUTH EVERY DAY      multivitamin tablet Take 1 tablet by mouth once daily      omega-3 fatty acids-fish oil 300-1,000 mg capsule Take 1 capsule by mouth once daily      omeprazole (PRILOSEC OTC) 20 MG EC tablet omeprazole magnesium 20 mg tablet,delayed release   20 mg by oral route.       No current facility-administered medications on file prior to visit.     Family History         Family History Problem Relation Age of Onset  Colon cancer Father        Social History       Tobacco Use Smoking Status Never Smoker Smokeless Tobacco Never Used     Social History Social History          Socioeconomic History  Marital status: Married Tobacco Use  Smoking status: Never Smoker  Smokeless tobacco: Never Used Surveyor, mining Use: Never used Substance and  Sexual Activity  Alcohol use: Never  Drug use: Never  Sexual activity: Defer      Objective:         Vitals:   04/27/21 1454 BP: (!) 190/100 Pulse: (!) 163 Temp: 37.1 C (98.7 F) SpO2: 97% Weight: (!) 125.8 kg (277 lb 6.4 oz) Height: 185.4 cm ('6\' 1"'$ )   Body mass index is 36.6 kg/m.   Physical Exam Constitutional:      Appearance: Normal appearance.  HENT:     Head: Normocephalic and atraumatic.     Nose: Nose normal.     Mouth/Throat:     Mouth: Mucous membranes are moist.     Pharynx: Oropharynx is clear.  Eyes:     Extraocular Movements: Extraocular movements intact.     Pupils: Pupils are equal, round, and reactive to light.  Cardiovascular:     Rate and Rhythm: Normal rate and regular rhythm.     Pulses: Normal pulses.  Pulmonary:     Effort: Pulmonary effort is normal.     Breath sounds: Normal breath sounds.  Abdominal:     General: Abdomen is flat.     Hernia: A hernia is present. Hernia is present in the umbilical area.  Skin:    General: Skin is warm and dry.  Neurological:     Mental Status: He is alert.  Psychiatric:        Mood and Affect: Mood normal.        Behavior: Behavior normal.        Thought Content: Thought content normal.              Labs, Imaging and Diagnostic Testing: None   Assessment and Plan: Diagnoses and all orders for this visit:   Umbilical hernia without obstruction or gangrene -     [facility] Bariatric Case Posting Request; Future       Patient is a 70 year old male with a primary umb hernia. 1.  Patient like to proceed to the operating for laparoscopic umbilical hernia repair with mesh 2.  All risks and benefits were discussed with the patient, to generally include infection, bleeding, damage to surrounding structures, acute and chronic nerve pain, and recurrence. Alternatives were offered and described.  All questions were answered and the patient voiced understanding of the procedure and wishes to proceed at this  point

## 2021-06-09 ENCOUNTER — Ambulatory Visit (HOSPITAL_COMMUNITY)
Admission: RE | Admit: 2021-06-09 | Discharge: 2021-06-09 | Disposition: A | Discharge status: Home / Self Care | Payer: Medicare HMO | Attending: General Surgery | Admitting: General Surgery

## 2021-06-09 ENCOUNTER — Ambulatory Visit (HOSPITAL_COMMUNITY): Payer: Medicare HMO | Admitting: Certified Registered"

## 2021-06-09 ENCOUNTER — Encounter (HOSPITAL_COMMUNITY)
Admission: RE | Disposition: A | Discharge status: Home / Self Care | Payer: Self-pay | Source: Home / Self Care | Attending: General Surgery

## 2021-06-09 DIAGNOSIS — E039 Hypothyroidism, unspecified: Secondary | ICD-10-CM | POA: Insufficient documentation

## 2021-06-09 DIAGNOSIS — Z79899 Other long term (current) drug therapy: Secondary | ICD-10-CM | POA: Insufficient documentation

## 2021-06-09 DIAGNOSIS — Z7982 Long term (current) use of aspirin: Secondary | ICD-10-CM | POA: Insufficient documentation

## 2021-06-09 DIAGNOSIS — Z884 Allergy status to anesthetic agent status: Secondary | ICD-10-CM | POA: Diagnosis not present

## 2021-06-09 DIAGNOSIS — G4730 Sleep apnea, unspecified: Secondary | ICD-10-CM | POA: Diagnosis not present

## 2021-06-09 DIAGNOSIS — E7800 Pure hypercholesterolemia, unspecified: Secondary | ICD-10-CM | POA: Diagnosis not present

## 2021-06-09 DIAGNOSIS — Z859 Personal history of malignant neoplasm, unspecified: Secondary | ICD-10-CM | POA: Insufficient documentation

## 2021-06-09 DIAGNOSIS — Z79890 Hormone replacement therapy (postmenopausal): Secondary | ICD-10-CM | POA: Diagnosis not present

## 2021-06-09 DIAGNOSIS — K420 Umbilical hernia with obstruction, without gangrene: Secondary | ICD-10-CM | POA: Diagnosis not present

## 2021-06-09 DIAGNOSIS — Z800 Family history of malignant neoplasm of digestive organs: Secondary | ICD-10-CM | POA: Insufficient documentation

## 2021-06-09 DIAGNOSIS — Z96659 Presence of unspecified artificial knee joint: Secondary | ICD-10-CM | POA: Insufficient documentation

## 2021-06-09 DIAGNOSIS — Z9989 Dependence on other enabling machines and devices: Secondary | ICD-10-CM | POA: Diagnosis not present

## 2021-06-09 HISTORY — PX: UMBILICAL HERNIA REPAIR: SHX196

## 2021-06-09 LAB — GLUCOSE, CAPILLARY
Glucose-Capillary: 94 mg/dL (ref 70–99)
Glucose-Capillary: 116 mg/dL — ABNORMAL HIGH (ref 70–99)

## 2021-06-09 SURGERY — REPAIR, HERNIA, UMBILICAL, ADULT
Anesthesia: General

## 2021-06-09 MED ORDER — DEXAMETHASONE SODIUM PHOSPHATE 10 MG/ML IJ SOLN
INTRAMUSCULAR | Status: AC
  Filled 2021-06-09: qty 1

## 2021-06-09 MED ORDER — EPHEDRINE 5 MG/ML INJ
INTRAVENOUS | Status: AC
  Filled 2021-06-09: qty 5

## 2021-06-09 MED ORDER — OXYCODONE HCL 5 MG PO TABS: 5.0000 mg | ORAL_TABLET | Freq: Once | ORAL | Status: DC | PRN

## 2021-06-09 MED ORDER — FENTANYL CITRATE (PF) 100 MCG/2ML IJ SOLN: 25.0000 ug | INTRAMUSCULAR | Status: DC | PRN

## 2021-06-09 MED ORDER — ONDANSETRON HCL 4 MG/2ML IJ SOLN
INTRAMUSCULAR | Status: DC | PRN
  Administered 2021-06-09: 4 mg via INTRAVENOUS

## 2021-06-09 MED ORDER — CHLORHEXIDINE GLUCONATE CLOTH 2 % EX PADS: 6.0000 | MEDICATED_PAD | Freq: Once | CUTANEOUS | Status: DC

## 2021-06-09 MED ORDER — ROCURONIUM BROMIDE 10 MG/ML (PF) SYRINGE
PREFILLED_SYRINGE | INTRAVENOUS | Status: DC | PRN
Start: 2021-06-09 — End: 2021-06-09
  Administered 2021-06-09: 20 mg via INTRAVENOUS
  Administered 2021-06-09: 10 mg via INTRAVENOUS

## 2021-06-09 MED ORDER — PHENYLEPHRINE HCL-NACL 20-0.9 MG/250ML-% IV SOLN
INTRAVENOUS | Status: DC | PRN
  Administered 2021-06-09: 20 ug/min via INTRAVENOUS

## 2021-06-09 MED ORDER — MIDAZOLAM HCL 2 MG/2ML IJ SOLN
INTRAMUSCULAR | Status: AC
  Filled 2021-06-09: qty 2

## 2021-06-09 MED ORDER — ACETAMINOPHEN 10 MG/ML IV SOLN: 1000.0000 mg | Freq: Once | INTRAVENOUS | Status: DC | PRN

## 2021-06-09 MED ORDER — SUGAMMADEX SODIUM 500 MG/5ML IV SOLN
INTRAVENOUS | Status: AC
  Filled 2021-06-09: qty 5

## 2021-06-09 MED ORDER — EPHEDRINE SULFATE-NACL 50-0.9 MG/10ML-% IV SOSY
PREFILLED_SYRINGE | INTRAVENOUS | Status: DC | PRN
  Administered 2021-06-09: 10 mg via INTRAVENOUS

## 2021-06-09 MED ORDER — TRAMADOL HCL 50 MG PO TABS: 50.0000 mg | ORAL_TABLET | Freq: Four times a day (QID) | ORAL | 0 refills | Status: DC | PRN

## 2021-06-09 MED ORDER — DEXAMETHASONE SODIUM PHOSPHATE 10 MG/ML IJ SOLN
INTRAMUSCULAR | Status: DC | PRN
  Administered 2021-06-09: 5 mg via INTRAVENOUS

## 2021-06-09 MED ORDER — CEFAZOLIN IN SODIUM CHLORIDE 3-0.9 GM/100ML-% IV SOLN
3.0000 g | INTRAVENOUS | Status: AC
  Administered 2021-06-09: 3 g via INTRAVENOUS
  Filled 2021-06-09: qty 100

## 2021-06-09 MED ORDER — PHENYLEPHRINE 40 MCG/ML (10ML) SYRINGE FOR IV PUSH (FOR BLOOD PRESSURE SUPPORT)
PREFILLED_SYRINGE | INTRAVENOUS | Status: DC | PRN
  Administered 2021-06-09: 80 ug via INTRAVENOUS
  Administered 2021-06-09: 120 ug via INTRAVENOUS
  Administered 2021-06-09 (×2): 80 ug via INTRAVENOUS

## 2021-06-09 MED ORDER — PHENYLEPHRINE 40 MCG/ML (10ML) SYRINGE FOR IV PUSH (FOR BLOOD PRESSURE SUPPORT)
PREFILLED_SYRINGE | INTRAVENOUS | Status: AC
  Filled 2021-06-09: qty 10

## 2021-06-09 MED ORDER — MIDAZOLAM HCL 2 MG/2ML IJ SOLN
INTRAMUSCULAR | Status: DC | PRN
Start: 2021-06-09 — End: 2021-06-09
  Administered 2021-06-09: 1 mg via INTRAVENOUS

## 2021-06-09 MED ORDER — LIDOCAINE 2% (20 MG/ML) 5 ML SYRINGE
INTRAMUSCULAR | Status: AC
  Filled 2021-06-09: qty 5

## 2021-06-09 MED ORDER — ROCURONIUM BROMIDE 10 MG/ML (PF) SYRINGE
PREFILLED_SYRINGE | INTRAVENOUS | Status: AC
  Filled 2021-06-09: qty 10

## 2021-06-09 MED ORDER — PROPOFOL 10 MG/ML IV BOLUS
INTRAVENOUS | Status: DC | PRN
  Administered 2021-06-09: 140 mg via INTRAVENOUS

## 2021-06-09 MED ORDER — BUPIVACAINE-EPINEPHRINE (PF) 0.25% -1:200000 IJ SOLN
INTRAMUSCULAR | Status: AC
  Filled 2021-06-09: qty 30

## 2021-06-09 MED ORDER — SUCCINYLCHOLINE CHLORIDE 200 MG/10ML IV SOSY
PREFILLED_SYRINGE | INTRAVENOUS | Status: AC
  Filled 2021-06-09: qty 10

## 2021-06-09 MED ORDER — OXYCODONE HCL 5 MG/5ML PO SOLN: 5.0000 mg | Freq: Once | ORAL | Status: DC | PRN

## 2021-06-09 MED ORDER — ONDANSETRON HCL 4 MG/2ML IJ SOLN
INTRAMUSCULAR | Status: AC
  Filled 2021-06-09: qty 2

## 2021-06-09 MED ORDER — KETOROLAC TROMETHAMINE 30 MG/ML IJ SOLN
INTRAMUSCULAR | Status: AC
  Filled 2021-06-09: qty 1

## 2021-06-09 MED ORDER — CHLORHEXIDINE GLUCONATE 0.12 % MT SOLN
15.0000 mL | Freq: Once | OROMUCOSAL | Status: AC
  Administered 2021-06-09: 15 mL via OROMUCOSAL
  Filled 2021-06-09: qty 15

## 2021-06-09 MED ORDER — LACTATED RINGERS IV SOLN: INTRAVENOUS | Status: DC

## 2021-06-09 MED ORDER — FENTANYL CITRATE (PF) 250 MCG/5ML IJ SOLN
INTRAMUSCULAR | Status: AC
  Filled 2021-06-09: qty 5

## 2021-06-09 MED ORDER — PROPOFOL 10 MG/ML IV BOLUS
INTRAVENOUS | Status: AC
  Filled 2021-06-09: qty 20

## 2021-06-09 MED ORDER — SUCCINYLCHOLINE CHLORIDE 200 MG/10ML IV SOSY
PREFILLED_SYRINGE | INTRAVENOUS | Status: DC | PRN
  Administered 2021-06-09: 140 mg via INTRAVENOUS

## 2021-06-09 MED ORDER — SUGAMMADEX SODIUM 500 MG/5ML IV SOLN
INTRAVENOUS | Status: DC | PRN
  Administered 2021-06-09: 250 mg via INTRAVENOUS

## 2021-06-09 MED ORDER — ORAL CARE MOUTH RINSE: 15.0000 mL | Freq: Once | OROMUCOSAL | Status: AC

## 2021-06-09 MED ORDER — FENTANYL CITRATE (PF) 250 MCG/5ML IJ SOLN
INTRAMUSCULAR | Status: DC | PRN
  Administered 2021-06-09: 25 ug via INTRAVENOUS
  Administered 2021-06-09: 50 ug via INTRAVENOUS
  Administered 2021-06-09: 100 ug via INTRAVENOUS
  Administered 2021-06-09: 50 ug via INTRAVENOUS
  Administered 2021-06-09: 25 ug via INTRAVENOUS

## 2021-06-09 MED ORDER — BUPIVACAINE-EPINEPHRINE 0.5% -1:200000 IJ SOLN
INTRAMUSCULAR | Status: DC | PRN
  Administered 2021-06-09: 6 mL

## 2021-06-09 MED ORDER — ACETAMINOPHEN 500 MG PO TABS
1000.0000 mg | ORAL_TABLET | ORAL | Status: AC
  Administered 2021-06-09: 1000 mg via ORAL
  Filled 2021-06-09: qty 2

## 2021-06-09 MED ORDER — KETOROLAC TROMETHAMINE 30 MG/ML IJ SOLN
INTRAMUSCULAR | Status: DC | PRN
  Administered 2021-06-09: 30 mg via INTRAVENOUS

## 2021-06-09 MED ORDER — ACETAMINOPHEN 160 MG/5ML PO SOLN: 1000.0000 mg | Freq: Once | ORAL | Status: DC | PRN

## 2021-06-09 MED ORDER — ACETAMINOPHEN 500 MG PO TABS: 1000.0000 mg | ORAL_TABLET | Freq: Once | ORAL | Status: DC | PRN

## 2021-06-09 SURGICAL SUPPLY — 41 items
ADH SKN CLS APL DERMABOND .7 (GAUZE/BANDAGES/DRESSINGS) ×1
APL PRP STRL LF DISP 70% ISPRP (MISCELLANEOUS) ×1
BAG COUNTER SPONGE SURGICOUNT (BAG) ×2 IMPLANT
BAG SPNG CNTER NS LX DISP (BAG) ×1
BLADE CLIPPER SURG (BLADE) ×1 IMPLANT
CANISTER SUCT 3000ML PPV (MISCELLANEOUS) ×2 IMPLANT
CHLORAPREP W/TINT 26 (MISCELLANEOUS) ×2 IMPLANT
COVER SURGICAL LIGHT HANDLE (MISCELLANEOUS) ×2 IMPLANT
DERMABOND ADVANCED (GAUZE/BANDAGES/DRESSINGS) ×1
DERMABOND ADVANCED .7 DNX12 (GAUZE/BANDAGES/DRESSINGS) ×1 IMPLANT
DEVICE SECURE STRAP 25 ABSORB (INSTRUMENTS) ×2 IMPLANT
DRAPE LAPAROTOMY 100X72 PEDS (DRAPES) ×2 IMPLANT
ELECT REM PT RETURN 9FT ADLT (ELECTROSURGICAL) ×2
ELECTRODE REM PT RTRN 9FT ADLT (ELECTROSURGICAL) ×1 IMPLANT
GAUZE 4X4 16PLY ~~LOC~~+RFID DBL (SPONGE) ×2 IMPLANT
GLOVE SRG 8 PF TXTR STRL LF DI (GLOVE) ×1 IMPLANT
GLOVE SURG ENC MOIS LTX SZ7.5 (GLOVE) ×2 IMPLANT
GLOVE SURG UNDER POLY LF SZ8 (GLOVE) ×2
GOWN STRL REUS W/ TWL LRG LVL3 (GOWN DISPOSABLE) ×1 IMPLANT
GOWN STRL REUS W/ TWL XL LVL3 (GOWN DISPOSABLE) ×1 IMPLANT
GOWN STRL REUS W/TWL LRG LVL3 (GOWN DISPOSABLE) ×2
GOWN STRL REUS W/TWL XL LVL3 (GOWN DISPOSABLE) ×2
KIT BASIN OR (CUSTOM PROCEDURE TRAY) ×2 IMPLANT
KIT TURNOVER KIT B (KITS) ×2 IMPLANT
MESH VENTRALIGHT ST 6IN CRC (Mesh General) ×1 IMPLANT
NDL HYPO 25GX1X1/2 BEV (NEEDLE) ×1 IMPLANT
NEEDLE HYPO 25GX1X1/2 BEV (NEEDLE) ×2 IMPLANT
NS IRRIG 1000ML POUR BTL (IV SOLUTION) ×2 IMPLANT
PACK GENERAL/GYN (CUSTOM PROCEDURE TRAY) ×2 IMPLANT
PAD ARMBOARD 7.5X6 YLW CONV (MISCELLANEOUS) ×2 IMPLANT
PENCIL SMOKE EVACUATOR (MISCELLANEOUS) ×2 IMPLANT
SUT ETHIBOND 0 MO6 C/R (SUTURE) ×1 IMPLANT
SUT MNCRL AB 4-0 PS2 18 (SUTURE) ×2 IMPLANT
SUT PROLENE 0 CT 1 30 (SUTURE) ×4 IMPLANT
SUT VIC AB 2-0 CT1 27 (SUTURE) ×2
SUT VIC AB 2-0 CT1 TAPERPNT 27 (SUTURE) ×1 IMPLANT
SUT VIC AB 3-0 SH 27 (SUTURE) ×2
SUT VIC AB 3-0 SH 27XBRD (SUTURE) ×1 IMPLANT
SYR CONTROL 10ML LL (SYRINGE) ×2 IMPLANT
TOWEL GREEN STERILE (TOWEL DISPOSABLE) ×2 IMPLANT
TOWEL GREEN STERILE FF (TOWEL DISPOSABLE) ×2 IMPLANT

## 2021-06-09 NOTE — Anesthesia Procedure Notes (Signed)
Procedure Name: Intubation Date/Time: 06/09/2021 8:38 AM Performed by: Asher Muir, CRNA Pre-anesthesia Checklist: Patient identified, Patient being monitored, Timeout performed, Emergency Drugs available and Suction available Patient Re-evaluated:Patient Re-evaluated prior to induction Oxygen Delivery Method: Circle System Utilized Preoxygenation: Pre-oxygenation with 100% oxygen Induction Type: IV induction Ventilation: Two handed mask ventilation required Laryngoscope Size: Glidescope, 4 and Mac Grade View: Grade I Tube type: Oral Tube size: 7.5 mm Number of attempts: 1 Airway Equipment and Method: stylet Placement Confirmation: ETT inserted through vocal cords under direct vision, positive ETCO2 and breath sounds checked- equal and bilateral Secured at: 22 cm Tube secured with: Tape Dental Injury: Teeth and Oropharynx as per pre-operative assessment

## 2021-06-09 NOTE — Interval H&P Note (Signed)
History and Physical Interval Note:  06/09/2021 7:33 AM  Dalphine Handing  has presented today for surgery, with the diagnosis of UMBILICAL HERNIA.  The various methods of treatment have been discussed with the patient and family. After consideration of risks, benefits and other options for treatment, the patient has consented to  Procedure(s): [place] (N/A) as a surgical intervention.  The patient's history has been reviewed, patient examined, no change in status, stable for surgery.  I have reviewed the patient's chart and labs.  Questions were answered to the patient's satisfaction.     Ralene Ok

## 2021-06-09 NOTE — Discharge Instructions (Signed)
[facility] _______[facility]  HERNIA REPAIR: POST OP INSTRUCTIONS  Always review your discharge instruction sheet given to you by the facility where your surgery was performed. IF YOU HAVE DISABILITY OR FAMILY LEAVE FORMS, YOU MUST BRING THEM TO THE OFFICE FOR PROCESSING.   DO NOT GIVE THEM TO YOUR DOCTOR.  1. A  prescription for pain medication may be given to you upon discharge.  Take your pain medication as prescribed, if needed.  If narcotic pain medicine is not needed, then you may take acetaminophen (Tylenol) or ibuprofen (Advil) as needed. 2. Take your usually prescribed medications unless otherwise directed. If you need a refill on your pain medication, please contact your pharmacy.  They will contact our office to request authorization. Prescriptions will not be filled after 5 pm or on week-ends. 3. You should follow a light diet the first 24 hours after arrival home, such as soup and crackers, etc.  Be sure to include lots of fluids daily.  Resume your normal diet the day after surgery. 4.Most patients will experience some swelling and bruising around the umbilicus or in the groin and scrotum.  Ice packs and reclining will help.  Swelling and bruising can take several days to resolve.  6. It is common to experience some constipation if taking pain medication after surgery.  Increasing fluid intake and taking a stool softener (such as Colace) will usually help or prevent this problem from occurring.  A mild laxative (Milk of Magnesia or Miralax) should be taken according to package directions if there are no bowel movements after 48 hours. 7. Unless discharge instructions indicate otherwise, you may remove your bandages 24-48 hours after surgery, and you may shower at that time.  You may have steri-strips (small skin tapes) in place directly over the incision.  These strips should be left on the skin for 7-10 days.  If your surgeon used skin glue on the incision, you may shower in  24 hours.  The glue will flake off over the next 2-3 weeks.  Any sutures or staples will be removed at the office during your follow-up visit. 8. ACTIVITIES:  You may resume regular (light) daily activities beginning the next day--such as daily self-care, walking, climbing stairs--gradually increasing activities as tolerated.  You may have sexual intercourse when it is comfortable.  Refrain from any heavy lifting or straining until approved by your doctor.  a.You may drive when you are no longer taking prescription pain medication, you can comfortably wear a seatbelt, and you can safely maneuver your car and apply brakes. b.RETURN TO WORK:   _____________________________________________  9.You should see your doctor in the office for a follow-up appointment approximately 2-3 weeks after your surgery.  Make sure that you call for this appointment within a day or two after you arrive home to insure a convenient appointment time. 10.OTHER INSTRUCTIONS: _________________________    _____________________________________  WHEN TO CALL YOUR DOCTOR: Fever over 101.0 Inability to urinate Nausea and/or vomiting Extreme swelling or bruising Continued bleeding from incision. Increased pain, redness, or drainage from the incision  The clinic staff is available to answer your questions during regular business hours.  Please don't hesitate to call and ask to speak to one of the nurses for clinical concerns.  If you have a medical emergency, go to the nearest emergency room or call 911.  A surgeon from [facility] is always on call at the hospital   [address], [address], [place], [place]    [address] ?  [address] [address], [place], [place]   [address] [phone] ? [phone] ? FAX [phone] Web site: [email or web address]  

## 2021-06-09 NOTE — Op Note (Signed)
06/09/2021  9:21 AM  PATIENT:  Joshua Graves  70 y.o. male  PRE-OPERATIVE DIAGNOSIS:  UMBILICAL HERNIA  POST-OPERATIVE DIAGNOSIS:  INCARCERATED UMBILICAL HERNIA  PROCEDURE:  Procedure(s): LAPAROSCOPIC UMBILICAL HERNIA REPAIR WITH MESH (N/A)  SURGEON:  Surgeon(s) and Role:    * Ralene Ok, MD - Primary   ANESTHESIA:   local and general  EBL:  5 mL   BLOOD ADMINISTERED:none  DRAINS: none   LOCAL MEDICATIONS USED:  BUPIVICAINE   SPECIMEN:  No Specimen  DISPOSITION OF SPECIMEN:  N/A  COUNTS:  YES  TOURNIQUET:  * No tourniquets in log *  DICTATION: .Dragon Dictation   Details of the procedure:   After the patient was consented patient was taken back to the operating room patient was then placed in supine position bilateral SCDs in place.  The patient was prepped and draped in the usual sterile fashion. After antibiotics were confirmed a timeout was called and all facts were verified. The Veress needle technique was used to insuflate the abdomen at Palmer's point. The abdomen was insufflated to 14 mm mercury. Subsequently a 5 mm trocar was placed a camera inserted there was no injury to any intra-abdominal organs.    There was seen to be an 1.5cm incarcerated umbilical hernia.  A second camera port was in placed into the left lower quadrant.   At this the Falicform ligament was taken down with Bovie cautery maintaining hemostasis.  I proceeded to reduce the hernia contents.  The hernia sac was dissected out of the hernia and disposed.  The fascia at the hernia was reapproximated using a #0 Ehtibonds  x 4.  Once the hernia was cleared away, a Bard Ventralight 15cm  mesh was inserted into the abdomen.  The mesh was secured circumferentially with am Securestrap tacker in a double crown fashion.    The omentum was brought over the area of the mesh. The pneumoperitoneum was evacuated  & all trocars  were removed. The skin was reapproximated with 4-0  Monocryl sutures in a  subcuticular fashion. The skin was dressed with Dermabond.  The patient was taken to the recovery room in stable condition.  Type of repair -primary suture & mesh  Mesh overlap - 7cm  Placement of mesh -  beneath fascia and into peritoneal cavity   PLAN OF CARE: Discharge to home after PACU  PATIENT DISPOSITION:  PACU - hemodynamically stable.   Delay start of Pharmacological VTE agent (>24hrs) due to surgical blood loss or risk of bleeding: not applicable

## 2021-06-09 NOTE — Anesthesia Preprocedure Evaluation (Signed)
Anesthesia Evaluation  Patient identified by MRN, date of birth, ID band Patient awake    Reviewed: Allergy & Precautions, NPO status , Patient's Chart, lab work & pertinent test results  History of Anesthesia Complications (+) PONV and history of anesthetic complications  Airway Mallampati: IV  TM Distance: >3 FB Neck ROM: Full  Mouth opening: Limited Mouth Opening  Dental  (+) Dental Advisory Given, Teeth Intact   Pulmonary neg shortness of breath, sleep apnea , neg COPD, neg recent URI, former smoker,  Covid-19 Nucleic Acid Test Results No results found for: SARSCOV2NAA, SARSCOV2    breath sounds clear to auscultation       Cardiovascular negative cardio ROS   Rhythm:Regular     Neuro/Psych  Headaches, negative psych ROS   GI/Hepatic Neg liver ROS, GERD  Medicated,  Endo/Other  Hypothyroidism Morbid obesity  Renal/GU negative Renal ROSLab Results      Component                Value               Date                      CREATININE               0.91                02/23/2018           Lab Results      Component                Value               Date                      K                        4.0                 06/11/2018                Musculoskeletal  (+) Arthritis ,   Abdominal   Peds  Hematology negative hematology ROS (+) Lab Results      Component                Value               Date                      WBC                      9.5                 06/01/2021                HGB                      14.8                06/01/2021                HCT                      46.0                06/01/2021  MCV                      97.3                06/01/2021                PLT                      229                 06/01/2021              Anesthesia Other Findings   Reproductive/Obstetrics                             Anesthesia Physical Anesthesia  Plan  ASA: 3  Anesthesia Plan: General   Post-op Pain Management:    Induction: Intravenous  PONV Risk Score and Plan: 3 and Ondansetron and Dexamethasone  Airway Management Planned: Oral ETT and Video Laryngoscope Planned  Additional Equipment: None  Intra-op Plan:   Post-operative Plan: Extubation in OR  Informed Consent: I have reviewed the patients History and Physical, chart, labs and discussed the procedure including the risks, benefits and alternatives for the proposed anesthesia with the patient or authorized representative who has indicated his/her understanding and acceptance.     Dental advisory given  Plan Discussed with: CRNA and Anesthesiologist  Anesthesia Plan Comments:         Anesthesia Quick Evaluation

## 2021-06-09 NOTE — Transfer of Care (Signed)
Immediate Anesthesia Transfer of Care Note  Patient: Joshua Graves  Procedure(s) Performed: LAPAROSCOPIC UMBILICAL HERNIA REPAIR WITH MESH  Patient Location: PACU  Anesthesia Type:General  Level of Consciousness: awake  Airway & Oxygen Therapy: Patient Spontanous Breathing and Patient connected to nasal cannula oxygen  Post-op Assessment: Report given to RN  Post vital signs: Reviewed and stable  Last Vitals:  Vitals Value Taken Time  BP 152/94 06/09/21 0939  Temp    Pulse 69 06/09/21 0939  Resp 23 06/09/21 0939  SpO2 98 % 06/09/21 0939  Vitals shown include unvalidated device data.  Last Pain:  Vitals:   06/09/21 0711  TempSrc: Oral         Complications: No notable events documented.

## 2021-06-10 ENCOUNTER — Encounter (HOSPITAL_COMMUNITY): Payer: Self-pay | Admitting: General Surgery

## 2021-06-10 NOTE — Anesthesia Postprocedure Evaluation (Signed)
Anesthesia Post Note  Patient: Joshua Graves  Procedure(s) Performed: LAPAROSCOPIC UMBILICAL HERNIA REPAIR WITH MESH     Patient location during evaluation: PACU Anesthesia Type: General Level of consciousness: awake and alert Pain management: pain level controlled Vital Signs Assessment: post-procedure vital signs reviewed and stable Respiratory status: spontaneous breathing, nonlabored ventilation, respiratory function stable and patient connected to nasal cannula oxygen Cardiovascular status: blood pressure returned to baseline and stable Postop Assessment: no apparent nausea or vomiting Anesthetic complications: no   No notable events documented.  Last Vitals:  Vitals:   06/09/21 0955 06/09/21 1010  BP: 135/86 122/76  Pulse: 72 72  Resp: 18 20  Temp:  36.6 C  SpO2: 96% 92%    Last Pain:  Vitals:   06/09/21 1010  TempSrc:   PainSc: 0-No pain                 Camia Dipinto

## 2021-06-13 ENCOUNTER — Encounter (HOSPITAL_COMMUNITY): Payer: Self-pay | Admitting: General Surgery

## 2021-07-21 DIAGNOSIS — C441122 Basal cell carcinoma of skin of right lower eyelid, including canthus: Secondary | ICD-10-CM | POA: Diagnosis not present

## 2021-07-21 DIAGNOSIS — D485 Neoplasm of uncertain behavior of skin: Secondary | ICD-10-CM | POA: Diagnosis not present

## 2021-07-21 DIAGNOSIS — C44311 Basal cell carcinoma of skin of nose: Secondary | ICD-10-CM | POA: Diagnosis not present

## 2021-08-06 DIAGNOSIS — M8580 Other specified disorders of bone density and structure, unspecified site: Secondary | ICD-10-CM | POA: Diagnosis not present

## 2021-08-06 DIAGNOSIS — R7303 Prediabetes: Secondary | ICD-10-CM | POA: Diagnosis not present

## 2021-08-06 DIAGNOSIS — E782 Mixed hyperlipidemia: Secondary | ICD-10-CM | POA: Diagnosis not present

## 2021-08-06 DIAGNOSIS — M25561 Pain in right knee: Secondary | ICD-10-CM | POA: Diagnosis not present

## 2021-08-06 DIAGNOSIS — E890 Postprocedural hypothyroidism: Secondary | ICD-10-CM | POA: Diagnosis not present

## 2021-08-06 DIAGNOSIS — Z87891 Personal history of nicotine dependence: Secondary | ICD-10-CM | POA: Diagnosis not present

## 2021-08-06 DIAGNOSIS — Z85850 Personal history of malignant neoplasm of thyroid: Secondary | ICD-10-CM | POA: Diagnosis not present

## 2021-09-06 DIAGNOSIS — L298 Other pruritus: Secondary | ICD-10-CM | POA: Diagnosis not present

## 2021-09-06 DIAGNOSIS — L538 Other specified erythematous conditions: Secondary | ICD-10-CM | POA: Diagnosis not present

## 2021-09-06 DIAGNOSIS — C441122 Basal cell carcinoma of skin of right lower eyelid, including canthus: Secondary | ICD-10-CM | POA: Diagnosis not present

## 2021-09-06 DIAGNOSIS — L821 Other seborrheic keratosis: Secondary | ICD-10-CM | POA: Diagnosis not present

## 2021-09-06 DIAGNOSIS — L814 Other melanin hyperpigmentation: Secondary | ICD-10-CM | POA: Diagnosis not present

## 2021-09-06 DIAGNOSIS — C44519 Basal cell carcinoma of skin of other part of trunk: Secondary | ICD-10-CM | POA: Diagnosis not present

## 2021-09-06 DIAGNOSIS — Z789 Other specified health status: Secondary | ICD-10-CM | POA: Diagnosis not present

## 2021-09-06 DIAGNOSIS — D485 Neoplasm of uncertain behavior of skin: Secondary | ICD-10-CM | POA: Diagnosis not present

## 2021-09-06 DIAGNOSIS — D1801 Hemangioma of skin and subcutaneous tissue: Secondary | ICD-10-CM | POA: Diagnosis not present

## 2021-09-06 DIAGNOSIS — C44311 Basal cell carcinoma of skin of nose: Secondary | ICD-10-CM | POA: Diagnosis not present

## 2021-09-06 DIAGNOSIS — L820 Inflamed seborrheic keratosis: Secondary | ICD-10-CM | POA: Diagnosis not present

## 2021-09-24 DIAGNOSIS — C44311 Basal cell carcinoma of skin of nose: Secondary | ICD-10-CM | POA: Diagnosis not present

## 2021-09-24 DIAGNOSIS — C4491 Basal cell carcinoma of skin, unspecified: Secondary | ICD-10-CM | POA: Diagnosis not present

## 2021-09-24 DIAGNOSIS — Z481 Encounter for planned postprocedural wound closure: Secondary | ICD-10-CM | POA: Diagnosis not present

## 2021-11-05 DIAGNOSIS — M25572 Pain in left ankle and joints of left foot: Secondary | ICD-10-CM | POA: Diagnosis not present

## 2021-11-05 DIAGNOSIS — M25561 Pain in right knee: Secondary | ICD-10-CM | POA: Diagnosis not present

## 2021-11-09 DIAGNOSIS — C44519 Basal cell carcinoma of skin of other part of trunk: Secondary | ICD-10-CM | POA: Diagnosis not present

## 2021-11-09 DIAGNOSIS — L905 Scar conditions and fibrosis of skin: Secondary | ICD-10-CM | POA: Diagnosis not present

## 2021-12-31 DIAGNOSIS — Z96652 Presence of left artificial knee joint: Secondary | ICD-10-CM | POA: Diagnosis not present

## 2021-12-31 DIAGNOSIS — M25561 Pain in right knee: Secondary | ICD-10-CM | POA: Diagnosis not present

## 2021-12-31 DIAGNOSIS — M1711 Unilateral primary osteoarthritis, right knee: Secondary | ICD-10-CM | POA: Diagnosis not present

## 2022-01-27 NOTE — Patient Instructions (Addendum)
DUE TO COVID-19 ONLY TWO VISITORS  (aged 70 and older)  IS ALLOWED TO COME WITH YOU AND STAY IN THE WAITING ROOM ONLY DURING PRE OP AND PROCEDURE.   ?**NO VISITORS ARE ALLOWED IN THE SHORT STAY AREA OR RECOVERY ROOM!!** ? ?IF YOU WILL BE ADMITTED INTO THE HOSPITAL YOU ARE ALLOWED ONLY FOUR SUPPORT PEOPLE DURING VISITATION HOURS ONLY (7 AM -8PM)   ?The support person(s) must pass our screening, gel in and out ?Visitors GUEST BADGE MUST BE WORN VISIBLY  ?One adult visitor may remain with you overnight and MUST be in the room by 8 P.M.  ? ?You are not required to quarantine, however you are required to wear a well-fitted mask when you are out and around people not in your household.  ?Hand Hygiene often ?Do NOT share personal items ?Notify your provider if you are in close contact with someone who has COVID or you develop fever 100.4 or greater, new onset of sneezing, cough, sore throat, shortness of breath or body aches. ?     ? Your procedure is scheduled on:  Tuesday, 02-15-22 ? ? Report to [facility] Main Entrance ? ?  Report to admitting at 10:10 AM ? ? Call this number if you have problems the morning of surgery [phone] ? ? Do not eat food :After Midnight. ? ? After Midnight you may have the following liquids until 9:45 AM DAY OF SURGERY ? ?Water ?Black Coffee (sugar ok, NO MILK/CREAM OR CREAMERS)  ?Tea (sugar ok, NO MILK/CREAM OR CREAMERS) regular and decaf                             ?Plain Jell-O (NO RED)                                           ?Fruit ices (not with fruit pulp, NO RED)                                     ?Popsicles (NO RED)                                                                  ?Juice: apple, WHITE grape, WHITE cranberry ?Sports drinks like Gatorade (NO RED) ?Clear broth(vegetable,chicken,beef) ? ?             ?Drink 1 G2 drink AT 9:45 AM the morning of surgery. ? ?  ?  ?The day of surgery:  ?Drink ONE (1) Pre-Surgery G2 at 9:45 AM the morning of surgery. Drink in one  sitting. Do not sip.  ?This drink was given to you during your hospital  ?pre-op appointment visit. ?Nothing else to drink after completing the Pre-Surgery G2. ?  ?       If you have questions, please contact your surgeon?s office. ? ? ?FOLLOW ANY ADDITIONAL PRE OP INSTRUCTIONS YOU RECEIVED FROM YOUR SURGEON'S OFFICE!!! ?  ?  ?Oral Hygiene is also important to reduce your risk of infection.                                    ?  Remember - BRUSH YOUR TEETH THE MORNING OF SURGERY WITH YOUR REGULAR TOOTHPASTE ? ? Do NOT smoke after Midnight ? ?Take these medicines the morning of surgery with A SIP OF WATER:  Tylenol, Atorvastatin, Levothyroxine, Claritin, Omeprazole ? ?Bring CPAP mask and tubing day of surgery. ?                  ?           You may not have any metal on your body including jewelry, and body piercing ? ?           Do not wear lotions, powders, cologne, or deodorant ? ?            Men may shave face and neck. ? ? Do not bring valuables to the hospital. [place]. ? ? Contacts, dentures or bridgework may not be worn into surgery. ? ? Bring small overnight bag day of surgery. ? ?Please read over the following fact sheets you were given: IF Joshua Graves  ? ?[place] - Preparing for Surgery ?Before surgery, you can play an important role.  Because skin is not sterile, your skin needs to be as free of germs as possible.  You can reduce the number of germs on your skin by washing with CHG (chlorahexidine gluconate) soap before surgery.  CHG is an antiseptic cleaner which kills germs and bonds with the skin to continue killing germs even after washing. ?Please DO NOT use if you have an allergy to CHG or antibacterial soaps.  If your skin becomes reddened/irritated stop using the CHG and inform your nurse when you arrive at Short Stay. ?Do not shave (including legs and underarms) for at least 48 hours prior  to the first CHG shower.  You may shave your face/neck. ? ?Please follow these instructions carefully: ? 1.  Shower with CHG Soap the night before surgery and the  morning of surgery. ? 2.  If you choose to wash your hair, wash your hair first as usual with your normal  shampoo. ? 3.  After you shampoo, rinse your hair and body thoroughly to remove the shampoo.                            ? 4.  Use CHG as you would any other liquid soap.  You can apply chg directly to the skin and wash.  Gently with a scrungie or clean washcloth. ? 5.  Apply the CHG Soap to your body ONLY FROM THE NECK DOWN.   Do   not use on face/ open      ?                     Wound or open sores. Avoid contact with eyes, ears mouth and   genitals (private parts).  ?                     Production manager,  Genitals (private parts) with your normal soap. ?            6.  Wash thoroughly, paying special attention to the area where your    surgery  will be performed. ? 7.  Thoroughly rinse your body with warm water from the neck down. ? 8.  DO NOT shower/wash with your normal soap after using  and rinsing off the CHG Soap. ?               9.  Pat yourself dry with a clean towel. ?           10.  Wear clean pajamas. ?           11.  Place clean sheets on your bed the night of your first shower and do not  sleep with pets. ?Day of Surgery : ?Do not apply any lotions/deodorants the morning of surgery.  Please wear clean clothes to the hospital/surgery center. ? ?FAILURE TO FOLLOW THESE INSTRUCTIONS MAY RESULT IN THE CANCELLATION OF YOUR SURGERY ? ?PATIENT SIGNATURE_________________________________ ? ?NURSE SIGNATURE__________________________________ ? ?________________________________________________________________________  ?  ? ?Incentive Spirometer ? ?An incentive spirometer is a tool that can help keep your lungs clear and active. This tool measures how well you are filling your lungs with each breath. Taking long deep breaths may help reverse or decrease  the chance of developing breathing (pulmonary) problems (especially infection) following: ?A long period of time when you are unable to move or be active. ?BEFORE THE PROCEDURE  ?If the spirometer includes an indicator to show your best effort, your nurse or respiratory therapist will set it to a desired goal. ?If possible, sit up straight or lean slightly forward. Try not to slouch. ?Hold the incentive spirometer in an upright position. ?INSTRUCTIONS FOR USE  ?Sit on the edge of your bed if possible, or sit up as far as you can in bed or on a chair. ?Hold the incentive spirometer in an upright position. ?Breathe out normally. ?Place the mouthpiece in your mouth and seal your lips tightly around it. ?Breathe in slowly and as deeply as possible, raising the piston or the ball toward the top of the column. ?Hold your breath for 3-5 seconds or for as long as possible. Allow the piston or ball to fall to the bottom of the column. ?Remove the mouthpiece from your mouth and breathe out normally. ?Rest for a few seconds and repeat Steps 1 through 7 at least 10 times every 1-2 hours when you are awake. Take your time and take a few normal breaths between deep breaths. ?The spirometer may include an indicator to show your best effort. Use the indicator as a goal to work toward during each repetition. ?After each set of 10 deep breaths, practice coughing to be sure your lungs are clear. If you have an incision (the cut made at the time of surgery), support your incision when coughing by placing a pillow or rolled up towels firmly against it. ?Once you are able to get out of bed, walk around indoors and cough well. You may stop using the incentive spirometer when instructed by your caregiver.  ?RISKS AND COMPLICATIONS ?Take your time so you do not get dizzy or light-headed. ?If you are in pain, you may need to take or ask for pain medication before doing incentive spirometry. It is harder to take a deep breath if you are  having pain. ?AFTER USE ?Rest and breathe slowly and easily. ?It can be helpful to keep track of a log of your progress. Your caregiver can provide you with a simple table to help with this. ?If you are usi

## 2022-02-02 ENCOUNTER — Encounter (HOSPITAL_COMMUNITY): Payer: Medicare HMO

## 2022-02-03 DIAGNOSIS — H02535 Eyelid retraction left lower eyelid: Secondary | ICD-10-CM | POA: Diagnosis not present

## 2022-02-03 DIAGNOSIS — S0181XS Laceration without foreign body of other part of head, sequela: Secondary | ICD-10-CM | POA: Diagnosis not present

## 2022-02-03 DIAGNOSIS — H02532 Eyelid retraction right lower eyelid: Secondary | ICD-10-CM | POA: Diagnosis not present

## 2022-02-03 DIAGNOSIS — H2513 Age-related nuclear cataract, bilateral: Secondary | ICD-10-CM | POA: Diagnosis not present

## 2022-02-03 DIAGNOSIS — S01412S Laceration without foreign body of left cheek and temporomandibular area, sequela: Secondary | ICD-10-CM | POA: Diagnosis not present

## 2022-02-03 DIAGNOSIS — C441122 Basal cell carcinoma of skin of right lower eyelid, including canthus: Secondary | ICD-10-CM | POA: Diagnosis not present

## 2022-02-03 DIAGNOSIS — S01411S Laceration without foreign body of right cheek and temporomandibular area, sequela: Secondary | ICD-10-CM | POA: Diagnosis not present

## 2022-02-03 NOTE — Progress Notes (Addendum)
COVID Vaccine Completed:  Yes x2 ?Date COVID Vaccine completed: ?Has received booster:  Yes x1 ?COVID vaccine manufacturer: Moderna    ? ?Date of COVID positive in last 90 days:  No ? ?PCP - Claris Gower, MD ?Cardiologist - N/A ? ?Chest x-ray - N/A ?EKG - 02-08-22 Epic ?Stress Test - N/A ?ECHO - N/A ?Cardiac Cath - N/A ?Pacemaker/ICD device last checked: ?Spinal Cord Stimulator: ? ?Bowel Prep - N/A ? ?Sleep Study - Yes, +sleep apnea ?CPAP - Yes ? ?Prediabetes ?Fasting Blood Sugar -  ?Checks Blood Sugar - does not check  ? ?Blood Thinner Instructions: ?Aspirin Instructions:  ASA 81 mg. To stop a week before per paient ?Last Dose: ? ?Activity level:   Can go up a flight of stairs and perform activities of daily living without stopping and without symptoms of chest pain or shortness of breath. ?   ?Anesthesia review:  Initial EKG showed sinus tach and BP elevated at 170/133.  Repeat EKG normal and BP 146/105.  Patient denied chest pain or shortness of breath.   Patient advised to notify PCP of BP readings and pulse elevation. ? ?Patient denies shortness of breath, fever, cough and chest pain at PAT appointment ? ?Patient verbalized understanding of instructions that were given to them at the PAT appointment. Patient was also instructed that they will need to review over the PAT instructions again at home before surgery.  ?

## 2022-02-04 DIAGNOSIS — R7303 Prediabetes: Secondary | ICD-10-CM | POA: Diagnosis not present

## 2022-02-04 DIAGNOSIS — E782 Mixed hyperlipidemia: Secondary | ICD-10-CM | POA: Diagnosis not present

## 2022-02-04 DIAGNOSIS — E890 Postprocedural hypothyroidism: Secondary | ICD-10-CM | POA: Diagnosis not present

## 2022-02-04 DIAGNOSIS — R0981 Nasal congestion: Secondary | ICD-10-CM | POA: Diagnosis not present

## 2022-02-04 DIAGNOSIS — I10 Essential (primary) hypertension: Secondary | ICD-10-CM | POA: Diagnosis not present

## 2022-02-04 DIAGNOSIS — Z79890 Hormone replacement therapy (postmenopausal): Secondary | ICD-10-CM | POA: Diagnosis not present

## 2022-02-04 DIAGNOSIS — Z85850 Personal history of malignant neoplasm of thyroid: Secondary | ICD-10-CM | POA: Diagnosis not present

## 2022-02-04 DIAGNOSIS — M8580 Other specified disorders of bone density and structure, unspecified site: Secondary | ICD-10-CM | POA: Diagnosis not present

## 2022-02-07 DIAGNOSIS — Z48817 Encounter for surgical aftercare following surgery on the skin and subcutaneous tissue: Secondary | ICD-10-CM | POA: Diagnosis not present

## 2022-02-08 ENCOUNTER — Encounter (HOSPITAL_COMMUNITY)
Admission: RE | Admit: 2022-02-08 | Discharge: 2022-02-08 | Disposition: A | Discharge status: Home / Self Care | Payer: Medicare HMO | Source: Ambulatory Visit | Attending: Orthopedic Surgery | Admitting: Orthopedic Surgery

## 2022-02-08 ENCOUNTER — Other Ambulatory Visit: Payer: Self-pay

## 2022-02-08 ENCOUNTER — Encounter (HOSPITAL_COMMUNITY): Payer: Self-pay

## 2022-02-08 VITALS — BP 146/105 | Temp 98.0°F | Resp 18 | Ht 74.0 in | Wt 281.2 lb

## 2022-02-08 DIAGNOSIS — M1711 Unilateral primary osteoarthritis, right knee: Secondary | ICD-10-CM | POA: Insufficient documentation

## 2022-02-08 DIAGNOSIS — Z01818 Encounter for other preprocedural examination: Secondary | ICD-10-CM | POA: Diagnosis not present

## 2022-02-08 DIAGNOSIS — R7303 Prediabetes: Secondary | ICD-10-CM | POA: Diagnosis not present

## 2022-02-08 HISTORY — DX: Unspecified malignant neoplasm of skin, unspecified: C44.90

## 2022-02-08 HISTORY — DX: Unspecified cataract: H26.9

## 2022-02-08 LAB — COMPREHENSIVE METABOLIC PANEL
ALT: 28 U/L (ref 0–44)
AST: 21 U/L (ref 15–41)
Albumin: 4.2 g/dL (ref 3.5–5.0)
Alkaline Phosphatase: 81 U/L (ref 38–126)
Anion gap: 6 (ref 5–15)
BUN: 17 mg/dL (ref 8–23)
CO2: 28 mmol/L (ref 22–32)
Calcium: 8.6 mg/dL — ABNORMAL LOW (ref 8.9–10.3)
Chloride: 108 mmol/L (ref 98–111)
Creatinine, Ser: 0.87 mg/dL (ref 0.61–1.24)
GFR, Estimated: >60 mL/min (ref >60)
Glucose, Bld: 89 mg/dL (ref 70–99)
Potassium: 3.8 mmol/L (ref 3.5–5.1)
Sodium: 142 mmol/L (ref 135–145)
Total Bilirubin: 1.4 mg/dL — ABNORMAL HIGH (ref 0.3–1.2)
Total Protein: 7.4 g/dL (ref 6.5–8.1)

## 2022-02-08 LAB — CBC
HCT: 44.8 % (ref 39.0–52.0)
Hemoglobin: 14.7 g/dL (ref 13.0–17.0)
MCH: 32.1 pg (ref 26.0–34.0)
MCHC: 32.8 g/dL (ref 30.0–36.0)
MCV: 97.8 fL (ref 80.0–100.0)
Platelets: 231 10*3/uL (ref 150–400)
RBC: 4.58 MIL/uL (ref 4.22–5.81)
RDW: 14.1 % (ref 11.5–15.5)
WBC: 9 10*3/uL (ref 4.0–10.5)
nRBC: 0 % (ref 0.0–0.2)

## 2022-02-08 LAB — SURGICAL PCR SCREEN
MRSA, PCR: NEGATIVE
Staphylococcus aureus: POSITIVE — AB

## 2022-02-08 LAB — HEMOGLOBIN A1C
Hgb A1c MFr Bld: 5.8 % — ABNORMAL HIGH (ref 4.8–5.6)
Mean Plasma Glucose: 119.76 mg/dL

## 2022-02-08 LAB — GLUCOSE, CAPILLARY: Glucose-Capillary: 96 mg/dL (ref 70–99)

## 2022-02-08 NOTE — Progress Notes (Signed)
PCR results sent to Dr. Olin to review.   

## 2022-02-11 DIAGNOSIS — I10 Essential (primary) hypertension: Secondary | ICD-10-CM | POA: Diagnosis not present

## 2022-02-11 DIAGNOSIS — Z0000 Encounter for general adult medical examination without abnormal findings: Secondary | ICD-10-CM | POA: Diagnosis not present

## 2022-02-14 NOTE — Progress Notes (Signed)
Called and discussed time change for surgery tomorrow.  Pt. Aware to be her at 0515; meds and gatorade completion times.  ?Pt. Will have to contact ride and make sure that he will be able to get him here at that time. ? ?

## 2022-02-15 ENCOUNTER — Encounter (HOSPITAL_COMMUNITY)
Admission: RE | Disposition: A | Discharge status: Home / Self Care | Payer: Self-pay | Source: Ambulatory Visit | Attending: Orthopedic Surgery

## 2022-02-15 ENCOUNTER — Encounter (HOSPITAL_COMMUNITY): Payer: Self-pay | Admitting: Orthopedic Surgery

## 2022-02-15 ENCOUNTER — Other Ambulatory Visit: Payer: Self-pay

## 2022-02-15 ENCOUNTER — Observation Stay (HOSPITAL_COMMUNITY)
Admission: RE | Admit: 2022-02-15 | Discharge: 2022-02-16 | Disposition: A | Discharge status: Home / Self Care | Payer: Medicare HMO | Source: Ambulatory Visit | Attending: Orthopedic Surgery | Admitting: Orthopedic Surgery

## 2022-02-15 ENCOUNTER — Ambulatory Visit (HOSPITAL_COMMUNITY): Payer: Medicare HMO | Admitting: Physician Assistant

## 2022-02-15 ENCOUNTER — Ambulatory Visit (HOSPITAL_BASED_OUTPATIENT_CLINIC_OR_DEPARTMENT_OTHER): Payer: Medicare HMO | Admitting: Certified Registered Nurse Anesthetist

## 2022-02-15 DIAGNOSIS — Z85850 Personal history of malignant neoplasm of thyroid: Secondary | ICD-10-CM | POA: Insufficient documentation

## 2022-02-15 DIAGNOSIS — M1711 Unilateral primary osteoarthritis, right knee: Principal | ICD-10-CM | POA: Insufficient documentation

## 2022-02-15 DIAGNOSIS — R262 Difficulty in walking, not elsewhere classified: Secondary | ICD-10-CM | POA: Insufficient documentation

## 2022-02-15 DIAGNOSIS — G8918 Other acute postprocedural pain: Secondary | ICD-10-CM | POA: Diagnosis not present

## 2022-02-15 DIAGNOSIS — G4730 Sleep apnea, unspecified: Secondary | ICD-10-CM

## 2022-02-15 DIAGNOSIS — M659 Synovitis and tenosynovitis, unspecified: Secondary | ICD-10-CM | POA: Diagnosis not present

## 2022-02-15 DIAGNOSIS — Z791 Long term (current) use of non-steroidal anti-inflammatories (NSAID): Secondary | ICD-10-CM | POA: Diagnosis not present

## 2022-02-15 DIAGNOSIS — M24561 Contracture, right knee: Secondary | ICD-10-CM | POA: Diagnosis not present

## 2022-02-15 DIAGNOSIS — Z6836 Body mass index (BMI) 36.0-36.9, adult: Secondary | ICD-10-CM | POA: Diagnosis not present

## 2022-02-15 DIAGNOSIS — K219 Gastro-esophageal reflux disease without esophagitis: Secondary | ICD-10-CM | POA: Diagnosis not present

## 2022-02-15 DIAGNOSIS — E039 Hypothyroidism, unspecified: Secondary | ICD-10-CM | POA: Diagnosis not present

## 2022-02-15 DIAGNOSIS — M25461 Effusion, right knee: Secondary | ICD-10-CM | POA: Insufficient documentation

## 2022-02-15 DIAGNOSIS — E669 Obesity, unspecified: Secondary | ICD-10-CM | POA: Diagnosis not present

## 2022-02-15 DIAGNOSIS — R519 Headache, unspecified: Secondary | ICD-10-CM | POA: Diagnosis not present

## 2022-02-15 DIAGNOSIS — Z87891 Personal history of nicotine dependence: Secondary | ICD-10-CM | POA: Insufficient documentation

## 2022-02-15 DIAGNOSIS — Z96652 Presence of left artificial knee joint: Secondary | ICD-10-CM | POA: Diagnosis not present

## 2022-02-15 DIAGNOSIS — R7303 Prediabetes: Secondary | ICD-10-CM | POA: Insufficient documentation

## 2022-02-15 DIAGNOSIS — E890 Postprocedural hypothyroidism: Secondary | ICD-10-CM | POA: Diagnosis not present

## 2022-02-15 DIAGNOSIS — M21161 Varus deformity, not elsewhere classified, right knee: Secondary | ICD-10-CM | POA: Insufficient documentation

## 2022-02-15 DIAGNOSIS — H269 Unspecified cataract: Secondary | ICD-10-CM | POA: Diagnosis not present

## 2022-02-15 DIAGNOSIS — M25561 Pain in right knee: Secondary | ICD-10-CM | POA: Insufficient documentation

## 2022-02-15 DIAGNOSIS — Z96651 Presence of right artificial knee joint: Secondary | ICD-10-CM

## 2022-02-15 DIAGNOSIS — Z85828 Personal history of other malignant neoplasm of skin: Secondary | ICD-10-CM | POA: Insufficient documentation

## 2022-02-15 DIAGNOSIS — M25761 Osteophyte, right knee: Secondary | ICD-10-CM | POA: Diagnosis not present

## 2022-02-15 DIAGNOSIS — M6281 Muscle weakness (generalized): Secondary | ICD-10-CM | POA: Insufficient documentation

## 2022-02-15 HISTORY — PX: TOTAL KNEE ARTHROPLASTY: SHX125

## 2022-02-15 LAB — TYPE AND SCREEN
ABO/RH(D): O POS
Antibody Screen: NEGATIVE

## 2022-02-15 LAB — GLUCOSE, CAPILLARY: Glucose-Capillary: 91 mg/dL (ref 70–99)

## 2022-02-15 SURGERY — ARTHROPLASTY, KNEE, TOTAL
Anesthesia: Regional | Site: Knee | Laterality: Right

## 2022-02-15 MED ORDER — HYDROMORPHONE HCL 1 MG/ML IJ SOLN: 0.2500 mg | INTRAMUSCULAR | Status: DC | PRN

## 2022-02-15 MED ORDER — ONDANSETRON HCL 4 MG/2ML IJ SOLN
INTRAMUSCULAR | Status: AC
  Filled 2022-02-15: qty 2

## 2022-02-15 MED ORDER — TRANEXAMIC ACID-NACL 1000-0.7 MG/100ML-% IV SOLN
1000.0000 mg | Freq: Once | INTRAVENOUS | Status: AC
  Administered 2022-02-15: 1000 mg via INTRAVENOUS
  Filled 2022-02-15: qty 100

## 2022-02-15 MED ORDER — ONDANSETRON HCL 4 MG PO TABS: 4.0000 mg | ORAL_TABLET | Freq: Four times a day (QID) | ORAL | Status: DC | PRN

## 2022-02-15 MED ORDER — METHOCARBAMOL 500 MG IVPB - SIMPLE MED
500.0000 mg | Freq: Four times a day (QID) | INTRAVENOUS | Status: DC | PRN
  Filled 2022-02-15: qty 50

## 2022-02-15 MED ORDER — SODIUM CHLORIDE (PF) 0.9 % IJ SOLN
INTRAMUSCULAR | Status: AC
  Filled 2022-02-15: qty 30

## 2022-02-15 MED ORDER — PHENYLEPHRINE HCL-NACL 20-0.9 MG/250ML-% IV SOLN
INTRAVENOUS | Status: DC | PRN
  Administered 2022-02-15: 50 ug/min via INTRAVENOUS

## 2022-02-15 MED ORDER — CEFAZOLIN IN SODIUM CHLORIDE 3-0.9 GM/100ML-% IV SOLN
3.0000 g | INTRAVENOUS | Status: AC
  Administered 2022-02-15: 3 g via INTRAVENOUS
  Filled 2022-02-15: qty 100

## 2022-02-15 MED ORDER — METHOCARBAMOL 500 MG PO TABS
500.0000 mg | ORAL_TABLET | Freq: Four times a day (QID) | ORAL | Status: DC | PRN
  Administered 2022-02-15 – 2022-02-16 (×4): 500 mg via ORAL
  Filled 2022-02-15 (×4): qty 1

## 2022-02-15 MED ORDER — BUPIVACAINE IN DEXTROSE 0.75-8.25 % IT SOLN
INTRATHECAL | Status: DC | PRN
  Administered 2022-02-15: 2 mL via INTRATHECAL

## 2022-02-15 MED ORDER — PROPOFOL 1000 MG/100ML IV EMUL
INTRAVENOUS | Status: AC
  Filled 2022-02-15: qty 100

## 2022-02-15 MED ORDER — LIDOCAINE HCL (CARDIAC) PF 100 MG/5ML IV SOSY
PREFILLED_SYRINGE | INTRAVENOUS | Status: DC | PRN
  Administered 2022-02-15: 50 mg via INTRAVENOUS

## 2022-02-15 MED ORDER — POVIDONE-IODINE 10 % EX SWAB
2.0000 "application " | Freq: Once | CUTANEOUS | Status: AC
  Administered 2022-02-15: 2 via TOPICAL

## 2022-02-15 MED ORDER — SODIUM CHLORIDE 0.9 % IR SOLN
Status: DC | PRN
  Administered 2022-02-15: 1000 mL

## 2022-02-15 MED ORDER — PROPOFOL 10 MG/ML IV BOLUS
INTRAVENOUS | Status: DC | PRN
  Administered 2022-02-15 (×2): 10 mg via INTRAVENOUS
  Administered 2022-02-15: 20 mg via INTRAVENOUS

## 2022-02-15 MED ORDER — OXYCODONE HCL 5 MG PO TABS: 5.0000 mg | ORAL_TABLET | Freq: Once | ORAL | Status: DC | PRN

## 2022-02-15 MED ORDER — SODIUM CHLORIDE (PF) 0.9 % IJ SOLN
INTRAMUSCULAR | Status: DC | PRN
  Administered 2022-02-15: 30 mL

## 2022-02-15 MED ORDER — OXYCODONE HCL 5 MG PO TABS
5.0000 mg | ORAL_TABLET | ORAL | Status: DC | PRN
  Administered 2022-02-16: 10 mg via ORAL
  Filled 2022-02-15: qty 2

## 2022-02-15 MED ORDER — CEFAZOLIN SODIUM-DEXTROSE 2-4 GM/100ML-% IV SOLN
2.0000 g | Freq: Four times a day (QID) | INTRAVENOUS | Status: AC
  Administered 2022-02-15 (×2): 2 g via INTRAVENOUS
  Filled 2022-02-15 (×2): qty 100

## 2022-02-15 MED ORDER — PROMETHAZINE HCL 25 MG/ML IJ SOLN: 6.2500 mg | INTRAMUSCULAR | Status: DC | PRN

## 2022-02-15 MED ORDER — TRANEXAMIC ACID-NACL 1000-0.7 MG/100ML-% IV SOLN
1000.0000 mg | INTRAVENOUS | Status: AC
  Administered 2022-02-15: 1000 mg via INTRAVENOUS
  Filled 2022-02-15: qty 100

## 2022-02-15 MED ORDER — OXYCODONE HCL 5 MG/5ML PO SOLN: 5.0000 mg | Freq: Once | ORAL | Status: DC | PRN

## 2022-02-15 MED ORDER — ROPIVACAINE HCL 5 MG/ML IJ SOLN: INTRAMUSCULAR | Status: DC | PRN

## 2022-02-15 MED ORDER — DIPHENHYDRAMINE HCL 12.5 MG/5ML PO ELIX: 12.5000 mg | ORAL_SOLUTION | ORAL | Status: DC | PRN

## 2022-02-15 MED ORDER — PHENYLEPHRINE HCL (PRESSORS) 10 MG/ML IV SOLN
INTRAVENOUS | Status: AC
  Filled 2022-02-15: qty 1

## 2022-02-15 MED ORDER — ORAL CARE MOUTH RINSE: 15.0000 mL | Freq: Once | OROMUCOSAL | Status: AC

## 2022-02-15 MED ORDER — PROPOFOL 10 MG/ML IV BOLUS
INTRAVENOUS | Status: AC
  Filled 2022-02-15: qty 20

## 2022-02-15 MED ORDER — PHENOL 1.4 % MT LIQD: 1.0000 | OROMUCOSAL | Status: DC | PRN

## 2022-02-15 MED ORDER — BUPIVACAINE-EPINEPHRINE (PF) 0.25% -1:200000 IJ SOLN
INTRAMUSCULAR | Status: AC
  Filled 2022-02-15: qty 30

## 2022-02-15 MED ORDER — CHLORHEXIDINE GLUCONATE 0.12 % MT SOLN
15.0000 mL | Freq: Once | OROMUCOSAL | Status: AC
  Administered 2022-02-15: 15 mL via OROMUCOSAL

## 2022-02-15 MED ORDER — POLYETHYLENE GLYCOL 3350 17 G PO PACK: 17.0000 g | PACK | Freq: Every day | ORAL | Status: DC | PRN

## 2022-02-15 MED ORDER — MIDAZOLAM HCL 2 MG/2ML IJ SOLN
INTRAMUSCULAR | Status: AC
  Filled 2022-02-15: qty 2

## 2022-02-15 MED ORDER — ONDANSETRON HCL 4 MG/2ML IJ SOLN: 4.0000 mg | Freq: Four times a day (QID) | INTRAMUSCULAR | Status: DC | PRN

## 2022-02-15 MED ORDER — MIDAZOLAM HCL 2 MG/2ML IJ SOLN
INTRAMUSCULAR | Status: DC | PRN
  Administered 2022-02-15 (×2): 1 mg via INTRAVENOUS

## 2022-02-15 MED ORDER — LIDOCAINE HCL (PF) 2 % IJ SOLN
INTRAMUSCULAR | Status: AC
  Filled 2022-02-15: qty 5

## 2022-02-15 MED ORDER — LEVOTHYROXINE SODIUM 100 MCG PO TABS: 200.0000 ug | ORAL_TABLET | ORAL | Status: DC

## 2022-02-15 MED ORDER — HYDROMORPHONE HCL 1 MG/ML IJ SOLN: 0.5000 mg | INTRAMUSCULAR | Status: DC | PRN

## 2022-02-15 MED ORDER — MENTHOL 3 MG MT LOZG: 1.0000 | LOZENGE | OROMUCOSAL | Status: DC | PRN

## 2022-02-15 MED ORDER — LORATADINE 10 MG PO TABS
10.0000 mg | ORAL_TABLET | Freq: Every day | ORAL | Status: DC
Start: 2022-02-16 — End: 2022-02-16
  Administered 2022-02-16: 10 mg via ORAL
  Filled 2022-02-15: qty 1

## 2022-02-15 MED ORDER — FENTANYL CITRATE (PF) 100 MCG/2ML IJ SOLN
INTRAMUSCULAR | Status: DC | PRN
  Administered 2022-02-15 (×2): 50 ug via INTRAVENOUS

## 2022-02-15 MED ORDER — CELECOXIB 200 MG PO CAPS
200.0000 mg | ORAL_CAPSULE | Freq: Two times a day (BID) | ORAL | Status: DC
  Administered 2022-02-15 – 2022-02-16 (×2): 200 mg via ORAL
  Filled 2022-02-15 (×2): qty 1

## 2022-02-15 MED ORDER — DEXAMETHASONE SODIUM PHOSPHATE 10 MG/ML IJ SOLN
INTRAMUSCULAR | Status: AC
  Filled 2022-02-15: qty 1

## 2022-02-15 MED ORDER — ASPIRIN 81 MG PO CHEW
81.0000 mg | CHEWABLE_TABLET | Freq: Two times a day (BID) | ORAL | Status: DC
  Administered 2022-02-15 – 2022-02-16 (×2): 81 mg via ORAL
  Filled 2022-02-15 (×2): qty 1

## 2022-02-15 MED ORDER — DOCUSATE SODIUM 100 MG PO CAPS
100.0000 mg | ORAL_CAPSULE | Freq: Two times a day (BID) | ORAL | Status: DC
  Administered 2022-02-15 – 2022-02-16 (×2): 100 mg via ORAL
  Filled 2022-02-15 (×2): qty 1

## 2022-02-15 MED ORDER — ACETAMINOPHEN 325 MG PO TABS: 325.0000 mg | ORAL_TABLET | Freq: Four times a day (QID) | ORAL | Status: DC | PRN

## 2022-02-15 MED ORDER — ATORVASTATIN CALCIUM 40 MG PO TABS
40.0000 mg | ORAL_TABLET | Freq: Every day | ORAL | Status: DC
  Filled 2022-02-15: qty 1

## 2022-02-15 MED ORDER — ROPIVACAINE HCL 5 MG/ML IJ SOLN
INTRAMUSCULAR | Status: DC | PRN
  Administered 2022-02-15: 20 mL via PERINEURAL

## 2022-02-15 MED ORDER — PANTOPRAZOLE SODIUM 40 MG PO TBEC
40.0000 mg | DELAYED_RELEASE_TABLET | Freq: Every day | ORAL | Status: DC
  Administered 2022-02-16: 40 mg via ORAL
  Filled 2022-02-15: qty 1

## 2022-02-15 MED ORDER — LACTATED RINGERS IV SOLN: INTRAVENOUS | Status: DC

## 2022-02-15 MED ORDER — KETOROLAC TROMETHAMINE 30 MG/ML IJ SOLN
INTRAMUSCULAR | Status: DC | PRN
  Administered 2022-02-15: 30 mg

## 2022-02-15 MED ORDER — BUPIVACAINE-EPINEPHRINE (PF) 0.25% -1:200000 IJ SOLN
INTRAMUSCULAR | Status: DC | PRN
Start: 2022-02-15 — End: 2022-02-15
  Administered 2022-02-15: 30 mL

## 2022-02-15 MED ORDER — PROPOFOL 500 MG/50ML IV EMUL
INTRAVENOUS | Status: DC | PRN
  Administered 2022-02-15: 80 ug/kg/min via INTRAVENOUS

## 2022-02-15 MED ORDER — DEXAMETHASONE SODIUM PHOSPHATE 10 MG/ML IJ SOLN: 8.0000 mg | Freq: Once | INTRAMUSCULAR | Status: DC

## 2022-02-15 MED ORDER — SODIUM CHLORIDE 0.9 % IV SOLN: INTRAVENOUS | Status: DC

## 2022-02-15 MED ORDER — FERROUS SULFATE 325 (65 FE) MG PO TABS
325.0000 mg | ORAL_TABLET | Freq: Three times a day (TID) | ORAL | Status: DC
  Administered 2022-02-16: 325 mg via ORAL
  Filled 2022-02-15: qty 1

## 2022-02-15 MED ORDER — OXYCODONE HCL 5 MG PO TABS
10.0000 mg | ORAL_TABLET | ORAL | Status: DC | PRN
  Administered 2022-02-15 (×3): 10 mg via ORAL
  Administered 2022-02-16 (×2): 15 mg via ORAL
  Administered 2022-02-16: 10 mg via ORAL
  Filled 2022-02-15: qty 3
  Filled 2022-02-15 (×2): qty 2
  Filled 2022-02-15 (×2): qty 3
  Filled 2022-02-15: qty 2

## 2022-02-15 MED ORDER — KETOROLAC TROMETHAMINE 30 MG/ML IJ SOLN
INTRAMUSCULAR | Status: AC
  Filled 2022-02-15: qty 1

## 2022-02-15 MED ORDER — DEXAMETHASONE SODIUM PHOSPHATE 10 MG/ML IJ SOLN
10.0000 mg | Freq: Once | INTRAMUSCULAR | Status: AC
  Administered 2022-02-16: 10 mg via INTRAVENOUS
  Filled 2022-02-15: qty 1

## 2022-02-15 MED ORDER — FENTANYL CITRATE (PF) 100 MCG/2ML IJ SOLN
INTRAMUSCULAR | Status: AC
Start: 2022-02-15 — End: ?
  Filled 2022-02-15: qty 2

## 2022-02-15 MED ORDER — ONDANSETRON HCL 4 MG/2ML IJ SOLN
INTRAMUSCULAR | Status: DC | PRN
  Administered 2022-02-15: 4 mg via INTRAVENOUS

## 2022-02-15 MED ORDER — PHENYLEPHRINE 80 MCG/ML (10ML) SYRINGE FOR IV PUSH (FOR BLOOD PRESSURE SUPPORT)
PREFILLED_SYRINGE | INTRAVENOUS | Status: DC | PRN
  Administered 2022-02-15 (×4): 80 ug via INTRAVENOUS

## 2022-02-15 MED ORDER — METOCLOPRAMIDE HCL 5 MG/ML IJ SOLN: 5.0000 mg | Freq: Three times a day (TID) | INTRAMUSCULAR | Status: DC | PRN

## 2022-02-15 MED ORDER — BISACODYL 10 MG RE SUPP: 10.0000 mg | Freq: Every day | RECTAL | Status: DC | PRN

## 2022-02-15 MED ORDER — METOCLOPRAMIDE HCL 5 MG PO TABS: 5.0000 mg | ORAL_TABLET | Freq: Three times a day (TID) | ORAL | Status: DC | PRN

## 2022-02-15 MED ORDER — ATORVASTATIN CALCIUM 40 MG PO TABS: 40.0000 mg | ORAL_TABLET | Freq: Every day | ORAL | Status: DC

## 2022-02-15 SURGICAL SUPPLY — 60 items
ADH SKN CLS APL DERMABOND .7 (GAUZE/BANDAGES/DRESSINGS) ×1
ATTUNE MED ANAT PAT 41 KNEE (Knees) ×1 IMPLANT
ATTUNE PS FEM RT SZ 7 CEM KNEE (Femur) ×1 IMPLANT
ATTUNE PSRP INSR SZ7 8 KNEE (Insert) ×1 IMPLANT
BAG COUNTER SPONGE SURGICOUNT (BAG) ×1 IMPLANT
BAG SPEC THK2 15X12 ZIP CLS (MISCELLANEOUS) ×1
BAG SPNG CNTER NS LX DISP (BAG) ×1
BAG ZIPLOCK 12X15 (MISCELLANEOUS) ×1 IMPLANT
BASE TIBIAL ROT PLAT SZ 8 KNEE (Knees) IMPLANT
BLADE SAW SGTL 11.0X1.19X90.0M (BLADE) IMPLANT
BLADE SAW SGTL 13.0X1.19X90.0M (BLADE) ×3 IMPLANT
BNDG ELASTIC 6X5.8 VLCR STR LF (GAUZE/BANDAGES/DRESSINGS) ×3 IMPLANT
BOWL SMART MIX CTS (DISPOSABLE) ×3 IMPLANT
BSPLAT TIB 8 CMNT ROT PLAT STR (Knees) ×1 IMPLANT
CEMENT HV SMART SET (Cement) ×2 IMPLANT
CUFF TOURN SGL QUICK 34 (TOURNIQUET CUFF) ×2
CUFF TRNQT CYL 34X4.125X (TOURNIQUET CUFF) ×2 IMPLANT
DERMABOND ADVANCED (GAUZE/BANDAGES/DRESSINGS) ×1
DERMABOND ADVANCED .7 DNX12 (GAUZE/BANDAGES/DRESSINGS) ×2 IMPLANT
DRAPE SHEET LG 3/4 BI-LAMINATE (DRAPES) ×3 IMPLANT
DRAPE U-SHAPE 47X51 STRL (DRAPES) ×3 IMPLANT
DRESSING AQUACEL AG SP 3.5X10 (GAUZE/BANDAGES/DRESSINGS) ×2 IMPLANT
DRSG AQUACEL AG SP 3.5X10 (GAUZE/BANDAGES/DRESSINGS) ×2
DURAPREP 26ML APPLICATOR (WOUND CARE) ×6 IMPLANT
ELECT REM PT RETURN 15FT ADLT (MISCELLANEOUS) ×3 IMPLANT
FACESHIELD WRAPAROUND (MASK) ×4 IMPLANT
FACESHIELD WRAPAROUND OR TEAM (MASK) ×4 IMPLANT
GLOVE BIO SURGEON STRL SZ 6 (GLOVE) ×3 IMPLANT
GLOVE BIO SURGEON STRL SZ7.5 (GLOVE) ×3 IMPLANT
GLOVE BIOGEL PI IND STRL 6.5 (GLOVE) ×2 IMPLANT
GLOVE BIOGEL PI IND STRL 7.5 (GLOVE) ×2 IMPLANT
GLOVE BIOGEL PI INDICATOR 6.5 (GLOVE) ×1
GLOVE BIOGEL PI INDICATOR 7.5 (GLOVE) ×1
GOWN STRL REUS W/ TWL LRG LVL3 (GOWN DISPOSABLE) ×4 IMPLANT
GOWN STRL REUS W/TWL LRG LVL3 (GOWN DISPOSABLE) ×4
HANDPIECE INTERPULSE COAX TIP (DISPOSABLE) ×2
HOLDER FOLEY CATH W/STRAP (MISCELLANEOUS) ×1 IMPLANT
KIT TURNOVER KIT A (KITS) IMPLANT
MANIFOLD NEPTUNE II (INSTRUMENTS) ×3 IMPLANT
NDL SAFETY ECLIPSE 18X1.5 (NEEDLE) IMPLANT
NEEDLE HYPO 18GX1.5 SHARP (NEEDLE) ×2
NS IRRIG 1000ML POUR BTL (IV SOLUTION) ×3 IMPLANT
PACK TOTAL KNEE CUSTOM (KITS) ×3 IMPLANT
PROTECTOR NERVE ULNAR (MISCELLANEOUS) ×3 IMPLANT
SET HNDPC FAN SPRY TIP SCT (DISPOSABLE) ×2 IMPLANT
SET PAD KNEE POSITIONER (MISCELLANEOUS) ×3 IMPLANT
SPIKE FLUID TRANSFER (MISCELLANEOUS) ×4 IMPLANT
SUT MNCRL AB 4-0 PS2 18 (SUTURE) ×3 IMPLANT
SUT STRATAFIX PDS+ 0 24IN (SUTURE) ×3 IMPLANT
SUT VIC AB 1 CT1 36 (SUTURE) ×3 IMPLANT
SUT VIC AB 2-0 CT1 27 (SUTURE) ×4
SUT VIC AB 2-0 CT1 TAPERPNT 27 (SUTURE) ×4 IMPLANT
SYR 3ML LL SCALE MARK (SYRINGE) ×3 IMPLANT
TIBIAL BASE ROT PLAT SZ 8 KNEE (Knees) ×2 IMPLANT
TOWEL GREEN STERILE FF (TOWEL DISPOSABLE) ×3 IMPLANT
TRAY CATH INTERMITTENT SS 16FR (CATHETERS) ×2 IMPLANT
TRAY FOLEY MTR SLVR 16FR STAT (SET/KITS/TRAYS/PACK) ×3 IMPLANT
TUBE SUCTION HIGH CAP CLEAR NV (SUCTIONS) ×3 IMPLANT
WATER STERILE IRR 1000ML POUR (IV SOLUTION) ×6 IMPLANT
WRAP KNEE MAXI GEL POST OP (GAUZE/BANDAGES/DRESSINGS) ×3 IMPLANT

## 2022-02-15 NOTE — Discharge Instructions (Signed)

## 2022-02-15 NOTE — Transfer of Care (Signed)
Immediate Anesthesia Transfer of Care Note ? ?Patient: Joshua Graves ? ?Procedure(s) Performed: TOTAL KNEE ARTHROPLASTY (Right: Knee) ? ?Patient Location: PACU ? ?Anesthesia Type:Spinal ? ?Level of Consciousness: drowsy and patient cooperative ? ?Airway & Oxygen Therapy: Patient Spontanous Breathing and Patient connected to face mask oxygen ? ?Post-op Assessment: Report given to RN and Post -op Vital signs reviewed and stable ? ?Post vital signs: Reviewed and stable ? ?Last Vitals:  ?Vitals Value Taken Time  ?BP 111/65 02/15/22 0915  ?Temp    ?Pulse 83 02/15/22 0917  ?Resp 19 02/15/22 0917  ?SpO2 94 % 02/15/22 0917  ?Vitals shown include unvalidated device data. ? ?Last Pain:  ?Vitals:  ? 02/15/22 0544  ?TempSrc: Oral  ?PainSc:   ?   ? ?Patients Stated Pain Goal: 4 (02/15/22 0537) ? ?Complications: No notable events documented. ?

## 2022-02-15 NOTE — Anesthesia Postprocedure Evaluation (Signed)
Anesthesia Post Note ? ?Patient: Joshua Graves ? ?Procedure(s) Performed: TOTAL KNEE ARTHROPLASTY (Right: Knee) ? ?  ? ?Patient location during evaluation: PACU ?Anesthesia Type: Spinal ?Level of consciousness: awake and alert ?Pain management: pain level controlled ?Vital Signs Assessment: post-procedure vital signs reviewed and stable ?Respiratory status: spontaneous breathing, nonlabored ventilation and respiratory function stable ?Cardiovascular status: blood pressure returned to baseline and stable ?Postop Assessment: no apparent nausea or vomiting ?Anesthetic complications: no ? ? ?No notable events documented. ? ?Last Vitals:  ?Vitals:  ? 02/15/22 1000 02/15/22 1015  ?BP: 114/83 (!) 131/91  ?Pulse: 69 65  ?Resp: (!) 24 19  ?Temp:  36.6 ?C  ?SpO2: 96% 92%  ?  ?Last Pain:  ?Vitals:  ? 02/15/22 1015  ?TempSrc:   ?PainSc: 0-No pain  ? ? ?  ?  ?  ?  ?  ?  ? ?Lynda Rainwater ? ? ? ? ?

## 2022-02-15 NOTE — Progress Notes (Signed)
Pt set up on cpap for the night. ?

## 2022-02-15 NOTE — Anesthesia Procedure Notes (Signed)
Spinal ? ?Patient location during procedure: OR ?Start time: 02/15/2022 7:27 AM ?End time: 02/15/2022 7:32 AM ?Reason for block: surgical anesthesia ?Preanesthetic Checklist ?Completed: patient identified, IV checked, site marked, risks and benefits discussed, surgical consent, monitors and equipment checked, pre-op evaluation and timeout performed ?Spinal Block ?Patient position: sitting ?Prep: DuraPrep ?Patient monitoring: heart rate, cardiac monitor, continuous pulse ox and blood pressure ?Approach: midline ?Location: L3-4 ?Injection technique: single-shot ?Needle ?Needle type: Quincke  ?Needle gauge: 22 G ?Needle length: 9 cm ?Assessment ?Sensory level: T4 ?Events: CSF return and second provider ?Additional Notes ?CRNA attempt with 24 gauge with introducer.  Unable to perform SAB because of depth.  Used 22g Quincke to perform block. ? ? ? ?

## 2022-02-15 NOTE — H&P (Addendum)
TOTAL KNEE ADMISSION H&P ? ?Patient is being admitted for right total knee arthroplasty. ? ?Subjective: ? ?Chief Complaint:right knee pain. ? ?HPI: Joshua Graves, 71 y.o. male, has a history of pain and functional disability in the right knee due to arthritis and has failed non-surgical conservative treatments for greater than 12 weeks to includeNSAID's and/or analgesics, corticosteriod injections, and activity modification.  Onset of symptoms was gradual, starting 2 years ago with gradually worsening course since that time. The patient noted no past surgery on the right knee(s).  Patient currently rates pain in the right knee(s) at 7 out of 10 with activity. Patient has worsening of pain with activity and weight bearing, pain that interferes with activities of daily living, and pain with passive range of motion.  Patient has evidence of joint space narrowing by imaging studies. There is no active infection. ? ?Patient Active Problem List  ? Diagnosis Date Noted  ? 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 ?Past Medical History:  ?Diagnosis Date  ? Cataracts, bilateral   ? GERD (gastroesophageal reflux disease)   ? Headache   ? Sinus headaches  ? History of kidney stones   ? Hypothyroidism   ? OA (osteoarthritis)   ? Pneumonia   ? PONV (postoperative nausea and vomiting)   ? Pre-diabetes   ? Skin cancer   ? Sleep apnea   ? CPAP  ? Thyroid cancer ([place]) 1991  ?  ?Past Surgical History:  ?Procedure Laterality Date  ? APPENDECTOMY    ? COLONOSCOPY    ? SKIN CANCER EXCISION    ? THYROID SURGERY  1991  ? TONSILLECTOMY AND ADENOIDECTOMY    ? TOTAL KNEE ARTHROPLASTY Left 02/21/2018  ? Procedure: LEFT TOTAL KNEE ARTHROPLASTY;  Surgeon: Latanya Maudlin, MD;  Location: [facility];  Service: Orthopedics;  Laterality: Left;  ? UMBILICAL HERNIA REPAIR N/A 06/09/2021  ? Procedure: LAPAROSCOPIC UMBILICAL HERNIA REPAIR WITH MESH;  Surgeon: Ralene Ok, MD;  Location: [facility];  Service: General;  Laterality: N/A;  ?  URETEROSCOPY WITH HOLMIUM LASER LITHOTRIPSY Right 06/11/2018  ? Procedure: URETEROSCOPY WITH HOLMIUM LASER LITHOTRIPSY/ STENT PLACEMENT;  Surgeon: Kathie Rhodes, MD;  Location: [facility];  Service: Urology;  Laterality: Right;  ?  ?Current Facility-Administered Medications  ?Medication Dose Route Frequency Provider Last Rate Last Admin  ? ceFAZolin (ANCEF) IVPB 3g/100 mL premix  3 g Intravenous On Call to OR Irving Copas, PA-C      ? dexamethasone (DECADRON) injection 8 mg  8 mg Intravenous Once Irving Copas, PA-C      ? lactated ringers infusion   Intravenous Continuous Irving Copas, PA-C      ? lactated ringers infusion   Intravenous Continuous Janeece Riggers, MD 10 mL/hr at 02/15/22 0546 New Bag at 02/15/22 0546  ? tranexamic acid (CYKLOKAPRON) IVPB 1,000 mg  1,000 mg Intravenous To OR Irving Copas, PA-C      ? ?No Known Allergies  ?Social History  ? ?Tobacco Use  ? Smoking status: Former  ?  Packs/day: 2.00  ?  Years: 52.00  ?  Pack years: 104.00  ?  Types: Cigarettes  ?  Quit date: 12/21/2017  ?  Years since quitting: 4.1  ? Smokeless tobacco: Never  ?Substance Use Topics  ? Alcohol use: Yes  ?  Comment: occassionaly  ?  ?History reviewed. No pertinent family history.  ? ?Review of Systems  ?Constitutional:  Negative for chills and fever.  ?Respiratory:  Negative  for cough and shortness of breath.   ?Cardiovascular:  Negative for chest pain.  ?Gastrointestinal:  Negative for nausea and vomiting.  ?Musculoskeletal:  Positive for arthralgias.  ? ? ?Objective: ? ?Physical Exam ?Well nourished and well developed. ?General: Alert and oriented x3, cooperative and pleasant, no acute distress. ?Head: normocephalic, atraumatic, neck supple. ?Eyes: EOMI. ? ?Musculoskeletal: ?Right knee exam: ?No palpable effusion, warmth or erythema ?Slight genu varum associated with a slight flexion contracture ?Tenderness over the medial and anterior aspect the knee ?Flexion with mild crepitation and  tightness over 110 degrees ? ?Calves soft and nontender. Motor function intact in LE. Strength 5/5 LE bilaterally. ?Neuro: Distal pulses 2+. Sensation to light touch intact in LE. ? ?Vital signs in last 24 hours: ?Temp:  [97.7 ?F (36.5 ?C)] 97.7 ?F (36.5 ?C) (04/25 0544) ?Pulse Rate:  [96] 96 (04/25 0544) ?Resp:  [17] 17 (04/25 0544) ?BP: (131)/(82) 131/82 (04/25 0544) ?SpO2:  [97 %] 97 % (04/25 0544) ?Weight:  [127.6 kg] 127.6 kg (04/25 0537) ? ?Labs: ? ? ?Estimated body mass index is 36.11 kg/m? as calculated from the following: ?  Height as of this encounter: '6\' 2"'$  (1.88 m). ?  Weight as of this encounter: 127.6 kg. ? ? ?Imaging Review ?Plain radiographs demonstrate severe degenerative joint disease of the right knee(s). The overall alignment isneutral. The bone quality appears to be adequate for age and reported activity level. ? ? ? ? ? ?Assessment/Plan: ? ?End stage arthritis, right knee  ? ?The patient history, physical examination, clinical judgment of the provider and imaging studies are consistent with end stage degenerative joint disease of the right knee(s) and total knee arthroplasty is deemed medically necessary. The treatment options including medical management, injection therapy arthroscopy and arthroplasty were discussed at length. The risks and benefits of total knee arthroplasty were presented and reviewed. The risks due to aseptic loosening, infection, stiffness, patella tracking problems, thromboembolic complications and other imponderables were discussed. The patient acknowledged the explanation, agreed to proceed with the plan and consent was signed. Patient is being admitted for inpatient treatment for surgery, pain control, PT, OT, prophylactic antibiotics, VTE prophylaxis, progressive ambulation and ADL's and discharge planning. The patient is planning to be discharged  home. ? ?Therapy Plans: outpatient therapy at [facility] ?Disposition: Home with brother (someone else will be driving him to  PT) ?Planned DVT Prophylaxis: aspirin '81mg'$  BID ?DME needed: none ?PCP: Dr. Grandville Silos (endocinologist), clearance received  ?TXA: IV ?Allergies: NKDA ?Anesthesia Concerns: none ?BMI: 37.3 ?Last HgbA1c: pre-diabetic ? ? ?Other: ?- Hx of left TKA 4 years ago ?- Oxycodone, robaxin, tylenol, celebrex ? ? ?Patient's anticipated LOS is less than 2 midnights, meeting these requirements: ?- Younger than 2 ?- Lives within 1 hour of care ?- Has a competent adult at home to recover with post-op recover ?- NO history of ? - Chronic pain requiring opiods ? - Diabetes ? - Coronary Artery Disease ? - Heart failure ? - Heart attack ? - Stroke ? - DVT/VTE ? - Cardiac arrhythmia ? - Respiratory Failure/COPD ? - Renal failure ? - Anemia ? - Advanced Liver disease ? ?Costella Hatcher, PA-C ?Orthopedic Surgery ?[facility] [place] ?[phone] ? ? ?

## 2022-02-15 NOTE — Interval H&P Note (Signed)
History and Physical Interval Note: ? ?02/15/2022 ?7:06 AM ? ?Joshua Graves  has presented today for surgery, with the diagnosis of Right knee osteoarthritis.  The various methods of treatment have been discussed with the patient and family. After consideration of risks, benefits and other options for treatment, the patient has consented to  Procedure(s): ?TOTAL KNEE ARTHROPLASTY (Right) as a surgical intervention.  The patient's history has been reviewed, patient examined, no change in status, stable for surgery.  I have reviewed the patient's chart and labs.  Questions were answered to the patient's satisfaction.   ? ? ?Mauri Pole ? ? ?

## 2022-02-15 NOTE — Care Plan (Signed)
Ortho Bundle Case Management Note ? ?Patient Details  ?Name: Joshua Graves ?MRN: [id number] ?Date of Birth: [birth year] ? ?R TKA on 02-15-22 ?DCP:  Home with brother ?DME:  No needs, has a RW ?PT:  [facility] on 02-18-22                ? ? ? ?DME Arranged:  N/A ?DME Agency:  NA ? ?HH Arranged:  NA ?[place] Agency:  NA ? ?Additional Comments: ?Please contact me with any questions of if this plan should need to change. ? ?Larwance Rote  [phone] ?02/15/2022, 9:43 AM ?  ?

## 2022-02-15 NOTE — Anesthesia Preprocedure Evaluation (Signed)
Anesthesia Evaluation  ?Patient identified by MRN, date of birth, ID band ?Patient awake ? ? ? ?Reviewed: ?Allergy & Precautions, NPO status , Patient's Chart, lab work & pertinent test results ? ?History of Anesthesia Complications ?(+) PONV and history of anesthetic complications ? ?Airway ?Mallampati: II ? ?TM Distance: >3 FB ?Neck ROM: Full ? ? ? Dental ?no notable dental hx. ? ?  ?Pulmonary ?sleep apnea , former smoker,  ?  ?Pulmonary exam normal ?breath sounds clear to auscultation ? ? ? ? ? ? Cardiovascular ?negative cardio ROS ?Normal cardiovascular exam ?Rhythm:Regular Rate:Normal ? ? ?  ?Neuro/Psych ? Headaches, negative psych ROS  ? GI/Hepatic ?Neg liver ROS, GERD  ,  ?Endo/Other  ?Hypothyroidism  ? Renal/GU ?negative Renal ROS  ?negative genitourinary ?  ?Musculoskeletal ? ?(+) Arthritis , Osteoarthritis,   ? Abdominal ?(+) + obese,   ?Peds ?negative pediatric ROS ?(+)  Hematology ?negative hematology ROS ?(+)   ?Anesthesia Other Findings ? ? Reproductive/Obstetrics ?negative OB ROS ? ?  ? ? ? ? ? ? ? ? ? ? ? ? ? ?  ?  ? ? ? ? ? ? ? ? ?Anesthesia Physical ?Anesthesia Plan ? ?ASA: 3 ? ?Anesthesia Plan: Spinal and Regional  ? ?Post-op Pain Management: Regional block* and Minimal or no pain anticipated  ? ?Induction: Intravenous ? ?PONV Risk Score and Plan: 2 and Ondansetron, Midazolam and Treatment may vary due to age or medical condition ? ?Airway Management Planned: Simple Face Mask ? ?Additional Equipment:  ? ?Intra-op Plan:  ? ?Post-operative Plan:  ? ?Informed Consent: I have reviewed the patients History and Physical, chart, labs and discussed the procedure including the risks, benefits and alternatives for the proposed anesthesia with the patient or authorized representative who has indicated his/her understanding and acceptance.  ? ? ? ?Dental advisory given ? ?Plan Discussed with: CRNA ? ?Anesthesia Plan Comments:   ? ? ? ? ? ? ?Anesthesia Quick Evaluation ? ?

## 2022-02-15 NOTE — Op Note (Signed)
NAME:  Joshua Graves                  ?   ? MEDICAL RECORD NO.:  [id number]  ?   ?                        FACILITY:  [facility]  ?   ? PHYSICIAN:  Pietro Cassis. Alvan Dame, M.D.  DATE OF BIRTH:  03/11/1951  ?   ? DATE OF PROCEDURE:  02/15/2022   ?  ?   ?                             OPERATIVE REPORT  ?   ?   ? PREOPERATIVE DIAGNOSIS:  Right knee osteoarthritis.  ?   ? POSTOPERATIVE DIAGNOSIS:  Right knee osteoarthritis.  ?   ? FINDINGS:  The patient was noted to have complete loss of cartilage and  ? bone-on-bone arthritis with associated osteophytes in the medial and patellofemoral compartments of  ? the knee with a significant synovitis and associated effusion.  The patient had failed months of conservative treatment including medications, injection therapy, activity modification. ?   ? PROCEDURE:  Right total knee replacement.  ?   ? COMPONENTS USED:  DePuy Attune rotating platform posterior stabilized knee  ? system, a size 7 femur, 8 tibia, size 8 mm PS AOX insert, and 41 anatomic patellar  ? button.  ?   ? SURGEON:  Pietro Cassis. Alvan Dame, M.D.  ?   ? ASSISTANT:  Costella Hatcher, PA-C.  ?   ? ANESTHESIA:  Regional and Spinal.  ?   ? SPECIMENS:  None.  ?   ? COMPLICATION:  None.  ?   ? DRAINS:  None. ? ?EBL: <100 cc  ?   ? TOURNIQUET TIME:   ?Total Tourniquet Time Documented: ?Thigh (Right) - 34 minutes ?Total: Thigh (Right) - 34 minutes ? .  ?   ? The patient was stable to the recovery room.  ?   ? INDICATION FOR PROCEDURE:  Joshua Graves is a 71 y.o. male patient of  ? mine.  The patient had been seen, evaluated, and treated for months conservatively in the  ? office with medication, activity modification, and injections.  The patient had  ? radiographic changes of bone-on-bone arthritis with endplate sclerosis and osteophytes noted.  Based on the radiographic changes and failed conservative measures, the patient  ? decided to proceed with definitive treatment, total knee replacement.  Risks of infection, DVT, component failure, need  for revision surgery, neurovascular injury were reviewed in the office setting.  The postop course was reviewed stressing the efforts to maximize post-operative satisfaction and function.  Consent was obtained for benefit of pain  ? relief.  ?   ? PROCEDURE IN DETAIL:  The patient was brought to the operative theater.  ? Once adequate anesthesia, preoperative antibiotics, 2 gm of Ancef,1 gm of Tranexamic Acid, and 10 mg of Decadron administered, the patient was positioned supine with a right thigh tourniquet placed.  The  ?right lower extremity was prepped and draped in sterile fashion.  A time-  ? out was performed identifying the patient, planned procedure, and the appropriate extremity.  ?   ? The right lower extremity was placed in the [facility] leg holder.  The leg was  ? exsanguinated, tourniquet elevated to 250 mmHg.  A midline incision was  ?  made followed by median parapatellar arthrotomy.  Following initial  ? exposure, attention was first directed to the patella.  Precut  ? measurement was noted to be 26 mm.  I resected down to 15 mm and used a  ? 41 anatomic patellar button to restore patellar height as well as cover the cut surface.  ?   ? The lug holes were drilled and a metal shim was placed to protect the  ? patella from retractors and saw blade during the procedure.  ?   ? At this point, attention was now directed to the femur.  The femoral  ? canal was opened with a drill, irrigated to try to prevent fat emboli.  An  ? intramedullary rod was passed at 5 degrees valgus, 9 mm of bone was  ? resected off the distal femur.  Following this resection, the tibia was  ? subluxated anteriorly.  Using the extramedullary guide, 3 mm of bone was resected off  ? the proximal medial tibia.  We confirmed the gap would be  ? stable medially and laterally with a size 6 spacer block as well as confirmed that the tibial cut was perpendicular in the coronal plane, checking with an alignment rod.  ?   ? Once this was  done, I sized the femur to be a size 7 in the anterior-  ? posterior dimension, chose a standard component based on medial and  ? lateral dimension.  The size 7 rotation block was then pinned in  ? position anterior referenced using the C-clamp to set rotation.  The  ? anterior, posterior, and  chamfer cuts were made without difficulty nor  ? notching making certain that I was along the anterior cortex to help  ? with flexion gap stability.  ?   ? The final box cut was made off the lateral aspect of distal femur.  ?   ? At this point, the tibia was sized to be a size 8.  The size 8 tray was  ? then pinned in position through the medial third of the tubercle,  ? drilled, and keel punched.  Trial reduction was now carried with a 7 femur, ? 8 tibia, a size 8 mm PS insert, and the 41 anatomic patella botton.  The knee was brought to full extension with good flexion stability with the patella  ? tracking through the trochlea without application of pressure.  Given  ? all these findings the trial components removed.  Final components were  ? opened and cement was mixed.  The knee was irrigated with normal saline solution and pulse lavage.  The synovial lining was  ? then injected with 30 cc of 0.25% Marcaine with epinephrine, 1 cc of Toradol and 30 cc of NS for a total of 61 cc.  ?   ?Final implants were then cemented onto cleaned and dried cut surfaces of bone with the knee brought to extension with a size 8 mm PS trial insert.  ?   ? Once the cement had fully cured, excess cement was removed  ? throughout the knee.  I confirmed that I was satisfied with the range of  ? motion and stability, and the final size 8 mm PS AOX insert was chosen.  It was  ? placed into the knee.  ?   ? The tourniquet had been let down at 34 minutes.  No significant  ? hemostasis was required.  The extensor mechanism was then reapproximated using #1 Vicryl  and #1 Stratafix sutures with the knee  ? in flexion.  The  ? remaining wound was closed  with 2-0 Vicryl and running 4-0 Monocryl.  ? The knee was cleaned, dried, dressed sterilely using Dermabond and  ? Aquacel dressing.  The patient was then  ? brought to recovery room in stable condition, tolerating the procedure  ? well.  ? ?Please note that Physician Assistant, Costella Hatcher, PA-C was present for the entirety of the case, and was utilized for pre-operative positioning, peri-operative retractor management, general facilitation of the procedure and for primary wound closure at the end of the case. ?   ?   ?   ?   ? Pietro Cassis Alvan Dame, M.D.  ? ? ?02/15/2022 8:48 AM  ?

## 2022-02-15 NOTE — Evaluation (Signed)
Physical Therapy Evaluation ?Patient Details ?Name: Joshua Graves ?MRN: [id number] ?DOB: 05-04-1951 ?Today's Date: 02/15/2022 ? ?History of Present Illness ? Patient is 71 y.o. male s/p Rt TKA on 02/15/22 with PMH significant for cataracts, GERD, hypothyroidism 2/2 thyroid cancer, OA, Lt TKA in 2019. ? ?  ?Clinical Impression ? Joshua Graves is a 71 y.o. male POD 0 s/p Rt TKA. Patient reports independence with mobility at baseline. Patient is now limited by functional impairments (see PT problem list below) and requires min assist for transfers and gait with RW. Patient was able to ambulate ~45 feet with RW and min assist. Patient instructed in exercise to facilitate ROM and circulation to manage edema and reduce risk of DVT. Patient will benefit from continued skilled PT interventions to address impairments and progress towards PLOF. Acute PT will follow to progress mobility and stair training in preparation for safe discharge home.    ?   ? ?Recommendations for follow up therapy are one component of a multi-disciplinary discharge planning process, led by the attending physician.  Recommendations may be updated based on patient status, additional functional criteria and insurance authorization. ? ?Follow Up Recommendations Follow physician's recommendations for discharge plan and follow up therapies ? ?  ?Assistance Recommended at Discharge Frequent or constant Supervision/Assistance  ?Patient can return home with the following ? Assist for transportation;Help with stairs or ramp for entrance;Assistance with cooking/housework;A little help with walking and/or transfers;A little help with bathing/dressing/bathroom ? ?  ?Equipment Recommendations None recommended by PT  ?Recommendations for Other Services ?    ?  ?Functional Status Assessment Patient has had a recent decline in their functional status and demonstrates the ability to make significant improvements in function in a reasonable and predictable amount of  time.  ? ?  ?Precautions / Restrictions Precautions ?Precautions: Fall ?Restrictions ?Weight Bearing Restrictions: No ?Other Position/Activity Restrictions: WBAT  ? ?  ? ?Mobility ? Bed Mobility ?Overal bed mobility: Needs Assistance ?Bed Mobility: Supine to Sit ?  ?  ?Supine to sit: Min assist, HOB elevated ?  ?  ?General bed mobility comments: cues needed for reachign to bed rail and assist to bring Rt LE off EOB. ?  ? ?Transfers ?Overall transfer level: Needs assistance ?Equipment used: Rolling walker (2 wheels) ?Transfers: Sit to/from Stand ?Sit to Stand: Min assist ?  ?  ?  ?  ?  ?General transfer comment: cues for technique/hand placement, EOB elevated to improve pt's ability to power up. Assist to complete rise and steady with hand transition to RW. ?  ? ?Ambulation/Gait ?Ambulation/Gait assistance: Min assist ?Gait Distance (Feet): 45 Feet ?Assistive device: Rolling walker (2 wheels) ?Gait Pattern/deviations: Step-to pattern, Decreased stride length, Decreased weight shift to right, Decreased stance time - right ?Gait velocity: decr ?  ?  ?General Gait Details: cues for safe step to pattern and proximity to RW, intermittent assist to keep walker close. distance limited to managem pain. ? ?Stairs ?  ?  ?  ?  ?  ? ?Wheelchair Mobility ?  ? ?Modified Rankin (Stroke Patients Only) ?  ? ?  ? ?Balance Overall balance assessment: Needs assistance ?Sitting-balance support: Feet supported ?Sitting balance-Leahy Scale: Good ?  ?  ?Standing balance support: Reliant on assistive device for balance, During functional activity, Bilateral upper extremity supported ?Standing balance-Leahy Scale: Poor ?  ?  ?  ?  ?  ?  ?  ?  ?  ?  ?  ?  ?   ? ? ? ?  Pertinent Vitals/Pain Pain Assessment ?Pain Assessment: 0-10 ?Pain Score: 3  ?Pain Location: Rt knee ?Pain Descriptors / Indicators: Aching, Discomfort ?Pain Intervention(s): Limited activity within patient's tolerance, Monitored during session, Repositioned  ? ? ?Home Living  Family/patient expects to be discharged to:: Private residence ?Living Arrangements: Alone ?  ?Type of Home: House ?Home Access: Stairs to enter ?Entrance Stairs-Rails: None ?Entrance Stairs-Number of Steps: 3 ?  ?Home Layout: One level ?Home Equipment: Conservation officer, nature (2 wheels);Cane - single point;Tub bench;Hand held shower head ?Additional Comments: pt reports his brother will stay one night only and then he is going to ask a friedn to help him with transportation for his therapy appointments  ?  ?Prior Function Prior Level of Function : Independent/Modified Independent ?  ?  ?  ?  ?  ?  ?Mobility Comments: using RW intermittently ?  ?  ? ? ?Hand Dominance  ? Dominant Hand: Right ? ?  ?Extremity/Trunk Assessment  ? Upper Extremity Assessment ?Upper Extremity Assessment: Overall WFL for tasks assessed ?  ? ?Lower Extremity Assessment ?Lower Extremity Assessment: Overall WFL for tasks assessed;RLE deficits/detail ?RLE Deficits / Details: good quad activation, no extensor lag wtih SLR ?RLE Sensation: WNL ?  ? ?Cervical / Trunk Assessment ?Cervical / Trunk Assessment: Normal  ?Communication  ? Communication: HOH  ?Cognition Arousal/Alertness: Awake/alert ?Behavior During Therapy: [facility] for tasks assessed/performed ?Overall Cognitive Status: Within Functional Limits for tasks assessed ?  ?  ?  ?  ?  ?  ?  ?  ?  ?  ?  ?  ?  ?  ?  ?  ?  ?  ?  ? ?  ?General Comments   ? ?  ?Exercises Total Joint Exercises ?Ankle Circles/Pumps: AROM, Both, 20 reps, Seated ?Quad Sets: AROM, Right, 5 reps, Seated ?Heel Slides: Right, 5 reps, Seated, AAROM  ? ?Assessment/Plan  ?  ?PT Assessment Patient needs continued PT services  ?PT Problem List Decreased strength;Decreased range of motion;Decreased activity tolerance;Decreased balance;Decreased mobility;Decreased knowledge of use of DME;Decreased knowledge of precautions;Pain ? ?   ?  ?PT Treatment Interventions DME instruction;Gait training;Stair training;Functional mobility  training;Therapeutic activities;Therapeutic exercise;Balance training;Neuromuscular re-education;Patient/family education   ? ?PT Goals (Current goals can be found in the Care Plan section)  ?Acute Rehab PT Goals ?Patient Stated Goal: get back home, find help for ?PT Goal Formulation: With patient ?Time For Goal Achievement: 02/15/22 ?Potential to Achieve Goals: Good ? ?  ?Frequency 7X/week ?  ? ? ?Co-evaluation   ?  ?  ?  ?  ? ? ?  ?AM-PAC PT "6 Clicks" Mobility  ?Outcome Measure Help needed turning from your back to your side while in a flat bed without using bedrails?: A Little ?Help needed moving from lying on your back to sitting on the side of a flat bed without using bedrails?: A Little ?Help needed moving to and from a bed to a chair (including a wheelchair)?: A Little ?Help needed standing up from a chair using your arms (e.g., wheelchair or bedside chair)?: A Little ?Help needed to walk in hospital room?: A Little ?Help needed climbing 3-5 steps with a railing? : A Lot ?6 Click Score: 17 ? ?  ?End of Session Equipment Utilized During Treatment: Gait belt ?Activity Tolerance: Patient tolerated treatment well ?Patient left: in chair;with call bell/phone within reach;with chair alarm set ?Nurse Communication: Mobility status ?PT Visit Diagnosis: Muscle weakness (generalized) (M62.81);Difficulty in walking, not elsewhere classified (R26.2);Pain ?Pain - Right/Left: Right ?Pain - part of  body: Knee ?  ? ?Time: 9323-5573 ?PT Time Calculation (min) (ACUTE ONLY): 31 min ? ? ?Charges:   PT Evaluation ?$PT Eval Low Complexity: 1 Low ?PT Treatments ?$Gait Training: 8-22 mins ?  ?   ? ? ?Gwynneth Albright PT, DPT ?Acute Rehabilitation Services ?Office [phone] ?Pager [phone]  ? ?Jacques Navy ?02/15/2022, 2:41 PM ? ?

## 2022-02-15 NOTE — Addendum Note (Signed)
Addendum  created 02/15/22 1357 by Raenette Rover, CRNA  ? Intraprocedure Meds edited  ?  ?

## 2022-02-15 NOTE — Anesthesia Procedure Notes (Signed)
Anesthesia Regional Block: Adductor canal block  ? ?Pre-Anesthetic Checklist: , timeout performed,  Correct Patient, Correct Site, Correct Laterality,  Correct Procedure, Correct Position, site marked,  Risks and benefits discussed,  Surgical consent,  Pre-op evaluation,  At surgeon's request and post-op pain management ? ?Laterality: Right ? ?Prep: chloraprep     ?  ?Needles:  ?Injection technique: Single-shot ? ?Needle Type: Stimiplex   ? ? ?Needle Length: 9cm  ?Needle Gauge: 21  ? ? ? ?Additional Needles: ? ? ?Procedures:,,,, ultrasound used (permanent image in chart),,    ?Narrative:  ?Start time: 02/15/2022 7:13 AM ?End time: 02/15/2022 7:23 AM ?Injection made incrementally with aspirations every 5 mL. ? ?Performed by: Personally  ?Anesthesiologist: Lynda Rainwater, MD ? ? ? ? ?

## 2022-02-16 ENCOUNTER — Encounter (HOSPITAL_COMMUNITY): Payer: Self-pay | Admitting: Orthopedic Surgery

## 2022-02-16 DIAGNOSIS — M1711 Unilateral primary osteoarthritis, right knee: Secondary | ICD-10-CM | POA: Diagnosis not present

## 2022-02-16 LAB — BASIC METABOLIC PANEL
Anion gap: 5 (ref 5–15)
BUN: 20 mg/dL (ref 8–23)
CO2: 25 mmol/L (ref 22–32)
Calcium: 7.4 mg/dL — ABNORMAL LOW (ref 8.9–10.3)
Chloride: 109 mmol/L (ref 98–111)
Creatinine, Ser: 1.11 mg/dL (ref 0.61–1.24)
GFR, Estimated: >60 mL/min (ref >60)
Glucose, Bld: 114 mg/dL — ABNORMAL HIGH (ref 70–99)
Potassium: 4.1 mmol/L (ref 3.5–5.1)
Sodium: 139 mmol/L (ref 135–145)

## 2022-02-16 LAB — CBC
HCT: 36.3 % — ABNORMAL LOW (ref 39.0–52.0)
Hemoglobin: 11.7 g/dL — ABNORMAL LOW (ref 13.0–17.0)
MCH: 32.2 pg (ref 26.0–34.0)
MCHC: 32.2 g/dL (ref 30.0–36.0)
MCV: 100 fL (ref 80.0–100.0)
Platelets: 184 10*3/uL (ref 150–400)
RBC: 3.63 MIL/uL — ABNORMAL LOW (ref 4.22–5.81)
RDW: 14.2 % (ref 11.5–15.5)
WBC: 10.5 10*3/uL (ref 4.0–10.5)
nRBC: 0 % (ref 0.0–0.2)

## 2022-02-16 MED ORDER — METHOCARBAMOL 500 MG PO TABS
500.0000 mg | ORAL_TABLET | Freq: Four times a day (QID) | ORAL | 1 refills | Status: AC | PRN
Start: 2022-02-16 — End: ?

## 2022-02-16 MED ORDER — OXYCODONE HCL 5 MG PO TABS: 5.0000 mg | ORAL_TABLET | ORAL | 0 refills | Status: AC | PRN

## 2022-02-16 MED ORDER — CELECOXIB 200 MG PO CAPS: 200.0000 mg | ORAL_CAPSULE | Freq: Two times a day (BID) | ORAL | 0 refills | Status: AC

## 2022-02-16 MED ORDER — DOCUSATE SODIUM 100 MG PO CAPS
100.0000 mg | ORAL_CAPSULE | Freq: Two times a day (BID) | ORAL | 0 refills | Status: AC
Start: 2022-02-16 — End: ?

## 2022-02-16 MED ORDER — ASPIRIN 81 MG PO CHEW: 81.0000 mg | CHEWABLE_TABLET | Freq: Two times a day (BID) | ORAL | 0 refills | Status: AC

## 2022-02-16 MED ORDER — POLYETHYLENE GLYCOL 3350 17 G PO PACK: 17.0000 g | PACK | Freq: Every day | ORAL | 0 refills | Status: AC | PRN

## 2022-02-16 NOTE — Progress Notes (Signed)
? ?  Subjective: ?1 Day Post-Op Procedure(s) (LRB): ?TOTAL KNEE ARTHROPLASTY (Right) ?Patient reports pain as mild.   ?Patient seen in rounds with Dr. Alvan Dame. ?Patient is well, and has had no acute complaints or problems. No acute events overnight. Foley catheter removed. Patient ambulated 45 feet with PT. He does not recall having as much pain with his prior TKA.  ?We will continue therapy today.  ? ?Objective: ?Vital signs in last 24 hours: ?Temp:  [97.5 ?F (36.4 ?C)-98.7 ?F (37.1 ?C)] 97.9 ?F (36.6 ?C) (04/26 4540) ?Pulse Rate:  [64-82] 71 (04/26 0437) ?Resp:  [15-24] 18 (04/26 0437) ?BP: (97-151)/(62-95) 98/62 (04/26 0437) ?SpO2:  [92 %-97 %] 96 % (04/26 0437) ? ?Intake/Output from previous day: ? ?Intake/Output Summary (Last 24 hours) at 02/16/2022 0745 ?Last data filed at 02/16/2022 0600 ?Gross per 24 hour  ?Intake 3785.86 ml  ?Output 2215 ml  ?Net 1570.86 ml  ?  ? ?Intake/Output this shift: ?No intake/output data recorded. ? ?Labs: ?Recent Labs  ?  02/16/22 ?0310  ?HGB 11.7*  ? ?Recent Labs  ?  02/16/22 ?0310  ?WBC 10.5  ?RBC 3.63*  ?HCT 36.3*  ?PLT 184  ? ?Recent Labs  ?  02/16/22 ?0310  ?NA 139  ?K 4.1  ?CL 109  ?CO2 25  ?BUN 20  ?CREATININE 1.11  ?GLUCOSE 114*  ?CALCIUM 7.4*  ? ?No results for input(s): LABPT, INR in the last 72 hours. ? ?Exam: ?General - Patient is Alert and Oriented ?Extremity - Neurologically intact ?Sensation intact distally ?Intact pulses distally ?Dorsiflexion/Plantar flexion intact ?Dressing - dressing C/D/I ?Motor Function - intact, moving foot and toes well on exam.  ? ?Past Medical History:  ?Diagnosis Date  ? Cataracts, bilateral   ? GERD (gastroesophageal reflux disease)   ? Headache   ? Sinus headaches  ? History of kidney stones   ? Hypothyroidism   ? OA (osteoarthritis)   ? Pneumonia   ? PONV (postoperative nausea and vomiting)   ? Pre-diabetes   ? Skin cancer   ? Sleep apnea   ? CPAP  ? Thyroid cancer ([place]) 1991  ? ? ?Assessment/Plan: ?1 Day Post-Op Procedure(s) (LRB): ?TOTAL  KNEE ARTHROPLASTY (Right) ?Principal Problem: ?  S/P total knee arthroplasty, right ? ?Estimated body mass index is 36.11 kg/m? as calculated from the following: ?  Height as of this encounter: '6\' 2"'$  (1.88 m). ?  Weight as of this encounter: 127.6 kg. ?Advance diet ?Up with therapy ?D/C IV fluids ? ? ?Patient's anticipated LOS is less than 2 midnights, meeting these requirements: ?- Younger than 38 ?- Lives within 1 hour of care ?- Has a competent adult at home to recover with post-op recover ?- NO history of ? - Chronic pain requiring opiods ? - Diabetes ? - Coronary Artery Disease ? - Heart failure ? - Heart attack ? - Stroke ? - DVT/VTE ? - Cardiac arrhythmia ? - Respiratory Failure/COPD ? - Renal failure ? - Anemia ? - Advanced Liver disease ? ?  ? ?DVT Prophylaxis - Aspirin ?Weight bearing as tolerated. ? ?Hgb stable at 11.7 this AM.  ? ?Plan is to go Home after hospital stay. Plan for discharge today following 1-2 sessions of PT as long as they are meeting their goals. Patient is scheduled for OPPT. Follow up in the office in 2 weeks.  ? ?Griffith Citron, PA-C ?Orthopedic Surgery ?[phone] ?02/16/2022, 7:45 AM  ?

## 2022-02-16 NOTE — Progress Notes (Signed)
Patient discharged to home w/ family. Given all belongings, instructions. Patient verbalized understanding of instructions. Patient verbalized that he feels safe to d/c to home w/ no one spending the night though it was recommended. He states he can have someone at his house in 10 minutes if needed. Escorted to pov via w/c. ?

## 2022-02-16 NOTE — Progress Notes (Signed)
Physical Therapy Treatment ?Patient Details ?Name: Joshua Graves ?MRN: [id number] ?DOB: [birth year] ?Today's Date: 02/16/2022 ? ? ?History of Present Illness Patient is 71 y.o. male s/p Rt TKA on 02/15/22 with PMH significant for cataracts, GERD, hypothyroidism 2/2 thyroid cancer, OA, Lt TKA in 2019. ? ?  ?PT Comments  ? ? Patient is progressing well with mobility, ambulated increased distance of ~75' with RW and min assist/cues for safe walker management. Pt overall steady with no buckling at Rt knee and no LOB noted, slightly decreased weight shift to Rt due to pain. EOS reviewed HEP for exercises in sitting to facilitate ROM and strength. Will follow up for additional session to complete stair training in preparation for safe discharge home. ? ?  ?Recommendations for follow up therapy are one component of a multi-disciplinary discharge planning process, led by the attending physician.  Recommendations may be updated based on patient status, additional functional criteria and insurance authorization. ? ?Follow Up Recommendations ? Follow physician's recommendations for discharge plan and follow up therapies ?  ?  ?Assistance Recommended at Discharge Frequent or constant Supervision/Assistance  ?Patient can return home with the following Assist for transportation;Help with stairs or ramp for entrance;Assistance with cooking/housework;A little help with walking and/or transfers;A little help with bathing/dressing/bathroom ?  ?Equipment Recommendations ? None recommended by PT  ?  ?Recommendations for Other Services   ? ? ?  ?Precautions / Restrictions Precautions ?Precautions: Fall ?Restrictions ?Weight Bearing Restrictions: No ?Other Position/Activity Restrictions: WBAT  ?  ? ?Mobility ? Bed Mobility ?Overal bed mobility: Needs Assistance ?Bed Mobility: Supine to Sit ?  ?  ?Supine to sit: Min assist, HOB elevated ?  ?  ?General bed mobility comments: verbal cues to reach for bed rail and to use belt to assist Rt LE off  EOB. Min assist to steady balance as pt raised trunk upright. ?  ? ?Transfers ?Overall transfer level: Needs assistance ?Equipment used: Rolling walker (2 wheels) ?Transfers: Sit to/from Stand ?Sit to Stand: Min assist ?  ?  ?  ?  ?  ?General transfer comment: VC's for hand placement to power up, pt using single UE to press up from EOB. Assist to steady walker. Pt completed with bil UE use from recliner to stand. demonstrates safe reach back and extends Rt knee with min VC's for conrolled lower to chair. ?  ? ?Ambulation/Gait ?Ambulation/Gait assistance: Min assist ?Gait Distance (Feet): 75 Feet ?Assistive device: Rolling walker (2 wheels) ?Gait Pattern/deviations: Step-to pattern, Decreased stride length, Decreased weight shift to right, Decreased stance time - right, Decreased dorsiflexion - right ?Gait velocity: decr ?  ?  ?General Gait Details: intermittent cues for safe proximity to RW and cues at start to keep walker on floor as pt tended to lift rather than roll. walker management improved throughout gait. pt with slightly antalgic gait: reduced stance on Rt LE, decreased dorsiflexion and VC for heel-to-toe pattern improved pt's step quality. ? ? ?Stairs ?  ?  ?  ?  ?  ? ? ?Wheelchair Mobility ?  ? ?Modified Rankin (Stroke Patients Only) ?  ? ? ?  ?Balance Overall balance assessment: Needs assistance ?Sitting-balance support: Feet supported ?Sitting balance-Leahy Scale: Good ?  ?  ?Standing balance support: Reliant on assistive device for balance, During functional activity, Bilateral upper extremity supported ?Standing balance-Leahy Scale: Poor ?  ?  ?  ?  ?  ?  ?  ?  ?  ?  ?  ?  ?  ? ?  ?  Cognition Arousal/Alertness: Awake/alert ?Behavior During Therapy: [facility] for tasks assessed/performed ?Overall Cognitive Status: Within Functional Limits for tasks assessed ?  ?  ?  ?  ?  ?  ?  ?  ?  ?  ?  ?  ?  ?  ?  ?  ?  ?  ?  ? ?  ?Exercises Total Joint Exercises ?Quad Sets: AROM, Right, Seated, 10 reps (tactile cue at  ankle with towel roll) ?Heel Slides: Right, 5 reps, Seated, AAROM (with belt and therapist assist) ?Hip ABduction/ADduction: Right, 5 reps, Seated, AAROM (with belt and therapist assist) ? ?  ?General Comments   ?  ?  ? ?Pertinent Vitals/Pain Pain Assessment ?Pain Assessment: 0-10 ?Pain Score: 7  ?Pain Location: Rt knee ?Pain Descriptors / Indicators: Aching, Discomfort ?Pain Intervention(s): Limited activity within patient's tolerance, Monitored during session, Patient requesting pain meds-RN notified, Repositioned, Ice applied  ? ? ?Home Living   ?  ?  ?  ?  ?  ?  ?  ?  ?  ?   ?  ?Prior Function    ?  ?  ?   ? ?PT Goals (current goals can now be found in the care plan section) Acute Rehab PT Goals ?Patient Stated Goal: get back home, find help for transportation ?PT Goal Formulation: With patient ?Time For Goal Achievement: 02/15/22 ?Potential to Achieve Goals: Good ?Progress towards PT goals: Progressing toward goals ? ?  ?Frequency ? ? ? 7X/week ? ? ? ?  ?PT Plan Current plan remains appropriate  ? ? ?Co-evaluation   ?  ?  ?  ?  ? ?  ?AM-PAC PT "6 Clicks" Mobility   ?Outcome Measure ? Help needed turning from your back to your side while in a flat bed without using bedrails?: A Little ?Help needed moving from lying on your back to sitting on the side of a flat bed without using bedrails?: A Little ?Help needed moving to and from a bed to a chair (including a wheelchair)?: A Little ?Help needed standing up from a chair using your arms (e.g., wheelchair or bedside chair)?: A Little ?Help needed to walk in hospital room?: A Little ?Help needed climbing 3-5 steps with a railing? : A Little ?6 Click Score: 18 ? ?  ?End of Session Equipment Utilized During Treatment: Gait belt ?Activity Tolerance: Patient tolerated treatment well ?Patient left: in chair;with call bell/phone within reach;with chair alarm set ?Nurse Communication: Mobility status ?PT Visit Diagnosis: Muscle weakness (generalized) (M62.81);Difficulty in  walking, not elsewhere classified (R26.2);Pain ?Pain - Right/Left: Right ?Pain - part of body: Knee ?  ? ? ?Time: 8469-6295 ?PT Time Calculation (min) (ACUTE ONLY): 29 min ? ?Charges:  $Gait Training: 8-22 mins ?$Therapeutic Exercise: 8-22 mins          ?          ? ?Gwynneth Albright PT, DPT ?Acute Rehabilitation Services ?Office [phone] ?Pager [phone]  ? ? ?Jacques Navy ?02/16/2022, 10:25 AM ? ?

## 2022-02-16 NOTE — TOC Transition Note (Signed)
Transition of Care (TOC) - CM/SW Discharge Note ? ? ?Patient Details  ?Name: Joshua Graves ?MRN: [id number] ?Date of Birth: 02-08-1951 ? ?Transition of Care (TOC) CM/SW Contact:  ?Joanann Mies, LCSW ?Phone Number: ?02/16/2022, 9:49 AM ? ? ?Clinical Narrative:    ? ?Met with patient and confirming he has all needed DME at home.  Plan for OP at [facility]. No TOC needs. ? ?Final next level of care: OP Rehab ?Barriers to Discharge: No Barriers Identified ? ? ?Patient Goals and CMS Choice ?Patient states their goals for this hospitalization and ongoing recovery are:: return home ?  ?  ? ?Discharge Placement ?  ?           ?  ?  ?  ?  ? ?Discharge Plan and Services ?  ?  ?           ?DME Arranged: N/A ?DME Agency: NA ?  ?  ?  ?HH Arranged: NA ?[place] Agency: NA ?  ?  ?  ? ?Social Determinants of Health (SDOH) Interventions ?  ? ? ?Readmission Risk Interventions ?   ? View : No data to display.  ?  ?  ?  ? ? ? ? ? ?

## 2022-02-16 NOTE — Progress Notes (Signed)
Physical Therapy Treatment ?Patient Details ?Name: Joshua Graves ?MRN: [id number] ?DOB: Jun 17, 1951 ?Today's Date: 02/16/2022 ? ? ?History of Present Illness Patient is 71 y.o. male s/p Rt TKA on 02/15/22 with PMH significant for cataracts, GERD, hypothyroidism 2/2 thyroid cancer, OA, Lt TKA in 2019. ? ?  ?PT Comments  ? ? Patient demonstrated good carryover from prior session with mobility. Pt ambulated ~70' with min guard and demonstrated safe management of walker. No overt LOB noted throughout. Discussed discharge plan for pt as he no longer has a family  member or friend staying with him when he goes home. He reports his brother will help him into his home and then will leave. Discussed concerns about being alone for the first 24 hours after discharge however pt states repeatedly will be fine and wishes to go home. Reviewed HEP and addressed questions for safe discharge. Pt is mobilizing well and is ready to return home with family/friends assist. Will continue to progress during his stay. ? ?  ?Recommendations for follow up therapy are one component of a multi-disciplinary discharge planning process, led by the attending physician.  Recommendations may be updated based on patient status, additional functional criteria and insurance authorization. ? ?Follow Up Recommendations ? Follow physician's recommendations for discharge plan and follow up therapies ?  ?  ?Assistance Recommended at Discharge Frequent or constant Supervision/Assistance  ?Patient can return home with the following Assist for transportation;Help with stairs or ramp for entrance;Assistance with cooking/housework;A little help with walking and/or transfers;A little help with bathing/dressing/bathroom ?  ?Equipment Recommendations ? None recommended by PT  ?  ?Recommendations for Other Services   ? ? ?  ?Precautions / Restrictions Precautions ?Precautions: Fall ?Restrictions ?Weight Bearing Restrictions: No ?Other Position/Activity Restrictions: WBAT   ?  ? ?Mobility ? Bed Mobility ?Overal bed mobility: Needs Assistance ?Bed Mobility: Supine to Sit ?  ?  ?Supine to sit: Min assist, HOB elevated ?  ?  ?General bed mobility comments: pt OOB in recliner ?  ? ?Transfers ?Overall transfer level: Needs assistance ?Equipment used: Rolling walker (2 wheels) ?Transfers: Sit to/from Stand ?Sit to Stand: Min guard ?  ?  ?  ?  ?  ?General transfer comment: pt demonstrated safe technique with hand placement for power up from recliner, min guard for safety ?  ? ?Ambulation/Gait ?Ambulation/Gait assistance: Min guard ?Gait Distance (Feet): 70 Feet ?Assistive device: Rolling walker (2 wheels) ?Gait Pattern/deviations: Step-to pattern, Decreased stride length, Decreased weight shift to right, Decreased stance time - right, Decreased dorsiflexion - right ?Gait velocity: decr ?  ?  ?General Gait Details: pt maintained safe proximity to RW with no overt LOB noted and min guard for safety only. ? ? ?Stairs ?Stairs: Yes ?Stairs assistance: Min assist ?Stair Management: No rails, Step to pattern, Forwards, With walker ?Number of Stairs: 3 (2x3) ?General stair comments: cues for step pattern "up with good, down with bad" and for safe walker position. Assist to stabilize walker on steps to ascend/descend. pt completed 2 bouts with no cues on second bout. ? ? ?Wheelchair Mobility ?  ? ?Modified Rankin (Stroke Patients Only) ?  ? ? ?  ?Balance Overall balance assessment: Needs assistance ?Sitting-balance support: Feet supported ?Sitting balance-Leahy Scale: Good ?  ?  ?Standing balance support: Reliant on assistive device for balance, During functional activity, Bilateral upper extremity supported ?Standing balance-Leahy Scale: Poor ?  ?  ?  ?  ?  ?  ?  ?  ?  ?  ?  ?  ?  ? ?  ?  Cognition Arousal/Alertness: Awake/alert ?Behavior During Therapy: [facility] for tasks assessed/performed ?Overall Cognitive Status: Within Functional Limits for tasks assessed ?  ?  ?  ?  ?  ?  ?  ?  ?  ?  ?  ?  ?  ?  ?   ?  ?  ?  ?  ? ?  ?Exercises Total Joint Exercises ?Ankle Circles/Pumps: AROM, Both, Seated, 10 reps ?Quad Sets: AROM, Right, Seated, 5 reps (tactile cue at ankle with towel roll) ?Short Arc Quad: AROM, Right, Seated, 5 reps ?Heel Slides: Right, 5 reps, Seated, AAROM (with belt and therapist assist) ?Hip ABduction/ADduction: Right, 5 reps, Seated, AAROM (with belt and therapist assist) ?Straight Leg Raises: AROM, Right, Seated (3) ? ?  ?General Comments   ?  ?  ? ?Pertinent Vitals/Pain Pain Assessment ?Pain Assessment: 0-10 ?Pain Score: 5  ?Pain Location: Rt knee ?Pain Descriptors / Indicators: Aching, Discomfort ?Pain Intervention(s): Limited activity within patient's tolerance, Monitored during session, Repositioned  ? ? ?Home Living   ?  ?  ?  ?  ?  ?  ?  ?  ?  ?   ?  ?Prior Function    ?  ?  ?   ? ?PT Goals (current goals can now be found in the care plan section) Acute Rehab PT Goals ?Patient Stated Goal: get back home, find help for transportation ?PT Goal Formulation: With patient ?Time For Goal Achievement: 02/15/22 ?Potential to Achieve Goals: Good ?Progress towards PT goals: Progressing toward goals ? ?  ?Frequency ? ? ? 7X/week ? ? ? ?  ?PT Plan Current plan remains appropriate  ? ? ?Co-evaluation   ?  ?  ?  ?  ? ?  ?AM-PAC PT "6 Clicks" Mobility   ?Outcome Measure ? Help needed turning from your back to your side while in a flat bed without using bedrails?: A Little ?Help needed moving from lying on your back to sitting on the side of a flat bed without using bedrails?: A Little ?Help needed moving to and from a bed to a chair (including a wheelchair)?: A Little ?Help needed standing up from a chair using your arms (e.g., wheelchair or bedside chair)?: A Little ?Help needed to walk in hospital room?: A Little ?Help needed climbing 3-5 steps with a railing? : A Little ?6 Click Score: 18 ? ?  ?End of Session Equipment Utilized During Treatment: Gait belt ?Activity Tolerance: Patient tolerated treatment  well ?Patient left: in chair;with call bell/phone within reach;with chair alarm set ?Nurse Communication: Mobility status ?PT Visit Diagnosis: Muscle weakness (generalized) (M62.81);Difficulty in walking, not elsewhere classified (R26.2);Pain ?Pain - Right/Left: Right ?Pain - part of body: Knee ?  ? ? ?Time: 2800-3491 ?PT Time Calculation (min) (ACUTE ONLY): 32 min ? ?Charges:  $Gait Training: 8-22 mins ?$Therapeutic Exercise: 8-22 mins          ?          ? ?Gwynneth Albright PT, DPT ?Acute Rehabilitation Services ?Office [phone] ?Pager [phone]  ? ? ?Jacques Navy ?02/16/2022, 3:52 PM ? ?

## 2022-02-23 DIAGNOSIS — M25661 Stiffness of right knee, not elsewhere classified: Secondary | ICD-10-CM | POA: Diagnosis not present

## 2022-02-24 NOTE — Discharge Summary (Signed)
Patient ID: ?Joshua Graves ?MRN: [id number] ?DOB/AGE: 1950/12/11 71 y.o. ? ?Admit date: 02/15/2022 ?Discharge date: 02/16/2022 ? ?Admission Diagnoses:  ?Right knee osteoarthritis ? ?Discharge Diagnoses:  ?Principal Problem: ?  S/P total knee arthroplasty, right ? ? ?Past Medical History:  ?Diagnosis Date  ? Cataracts, bilateral   ? GERD (gastroesophageal reflux disease)   ? Headache   ? Sinus headaches  ? History of kidney stones   ? Hypothyroidism   ? OA (osteoarthritis)   ? Pneumonia   ? PONV (postoperative nausea and vomiting)   ? Pre-diabetes   ? Skin cancer   ? Sleep apnea   ? CPAP  ? Thyroid cancer ([place]) 1991  ? ? ?Surgeries: Procedure(s): ?TOTAL KNEE ARTHROPLASTY on 02/15/2022 ?  ?Consultants:  ? ?Discharged Condition: Improved ? ?Hospital Course: Joshua Graves is an 71 y.o. male who was admitted 02/15/2022 for operative treatment ofS/P total knee arthroplasty, right. Patient has severe unremitting pain that affects sleep, daily activities, and work/hobbies. After pre-op clearance the patient was taken to the operating room on 02/15/2022 and underwent  Procedure(s): ?TOTAL KNEE ARTHROPLASTY.   ? ?Patient was given perioperative antibiotics:  ?Anti-infectives (From admission, onward)  ? ? Start     Dose/Rate Route Frequency Ordered Stop  ? 02/15/22 1400  ceFAZolin (ANCEF) IVPB 2g/100 mL premix       ? 2 g ?200 mL/hr over 30 Minutes Intravenous Every 6 hours 02/15/22 1030 02/15/22 2014  ? 02/15/22 0600  ceFAZolin (ANCEF) IVPB 3g/100 mL premix       ? 3 g ?200 mL/hr over 30 Minutes Intravenous On call to O.R. 02/15/22 6270 02/15/22 0753  ? ?  ?  ? ?Patient was given sequential compression devices, early ambulation, and chemoprophylaxis to prevent DVT. Patient worked with PT and was meeting their goals regarding safe ambulation and transfers. ? ?Patient benefited maximally from hospital stay and there were no complications.   ? ?Recent vital signs: No data found.  ? ?Recent laboratory studies: No results for input(s):  WBC, HGB, HCT, PLT, NA, K, CL, CO2, BUN, CREATININE, GLUCOSE, INR, CALCIUM in the last 72 hours. ? ?Invalid input(s): PT, 2 ? ? ?Discharge Medications:   ?Allergies as of 02/16/2022   ?No Known Allergies ?  ? ?  ?Medication List  ?  ? ?STOP taking these medications   ? ?aspirin EC 81 MG tablet ?Replaced by: aspirin 81 MG chewable tablet ?  ?ibuprofen 200 MG tablet ?Commonly known as: ADVIL ?  ?meloxicam 15 MG tablet ?Commonly known as: MOBIC ?  ?traMADol 50 MG tablet ?Commonly known as: Ultram ?  ? ?  ? ?TAKE these medications   ? ?acetaminophen 650 MG CR tablet ?Commonly known as: TYLENOL ?Take 1,300 mg by mouth daily as needed for pain. ?  ?aspirin 81 MG chewable tablet ?Chew 1 tablet (81 mg total) by mouth 2 (two) times daily for 28 days. ?Replaces: aspirin EC 81 MG tablet ?  ?atorvastatin 40 MG tablet ?Commonly known as: LIPITOR ?Take 40 mg by mouth daily. ?  ?carboxymethylcellulose 0.5 % Soln ?Commonly known as: REFRESH PLUS ?Place 1 drop into both eyes daily as needed (dry eyes). ?  ?celecoxib 200 MG capsule ?Commonly known as: CELEBREX ?Take 1 capsule (200 mg total) by mouth 2 (two) times daily. ?  ?cholecalciferol 25 MCG (1000 UNIT) tablet ?Commonly known as: VITAMIN D3 ?Take 1,000 Units by mouth daily. ?  ?CITRACAL + D PO ?Take 1 tablet by mouth daily. ?  ?docusate sodium  100 MG capsule ?Commonly known as: COLACE ?Take 1 capsule (100 mg total) by mouth 2 (two) times daily. ?  ?Fish Oil 1000 MG Caps ?Take 1,000 mg by mouth daily. ?  ?hydrocortisone cream 1 % ?Apply 1 application. topically daily as needed for itching. ?  ?levothyroxine 200 MCG tablet ?Commonly known as: SYNTHROID ?Take 200 mcg by mouth See admin instructions. Take 200 mcg by mouth daily, six days per week. ?  ?loratadine 10 MG tablet ?Commonly known as: CLARITIN ?Take 10 mg by mouth daily. ?  ?METAMUCIL FIBER PO ?Take 3 capsules by mouth 3 (three) times a week. ?  ?methocarbamol 500 MG tablet ?Commonly known as: ROBAXIN ?Take 1 tablet (500 mg  total) by mouth every 6 (six) hours as needed for muscle spasms. ?  ?multivitamin with minerals Tabs tablet ?Take 1 tablet by mouth daily. ?  ?omeprazole 20 MG tablet ?Commonly known as: PRILOSEC OTC ?Take 20 mg by mouth daily. ?  ?oxyCODONE 5 MG immediate release tablet ?Commonly known as: Oxy IR/ROXICODONE ?Take 1-2 tablets (5-10 mg total) by mouth every 4 (four) hours as needed for severe pain. ?  ?oxymetazoline 0.05 % nasal spray ?Commonly known as: AFRIN ?Place 1 spray into both nostrils 2 (two) times daily as needed for congestion. ?  ?polyethylene glycol 17 g packet ?Commonly known as: MIRALAX / GLYCOLAX ?Take 17 g by mouth daily as needed for mild constipation. ?  ? ?  ? ?  ?  ? ? ?  ?Discharge Care Instructions  ?(From admission, onward)  ?  ? ? ?  ? ?  Start     Ordered  ? 02/16/22 0000  Change dressing       ?Comments: Maintain surgical dressing until follow up in the clinic. If the edges start to pull up, may reinforce with tape. If the dressing is no longer working, may remove and cover with gauze and tape, but must keep the area dry and clean.  Call with any questions or concerns.  ? 02/16/22 0748  ? ?  ?  ? ?  ? ? ?Diagnostic Studies: No results found. ? ?Disposition: Discharge disposition: 01-Home or Self Care ? ? ? ? ? ? ?Discharge Instructions   ? ? Call MD / Call 911   Complete by: As directed ?  ? If you experience chest pain or shortness of breath, CALL 911 and be transported to the hospital emergency room.  If you develope a fever above 101 F, pus (white drainage) or increased drainage or redness at the wound, or calf pain, call your surgeon's office.  ? Change dressing   Complete by: As directed ?  ? Maintain surgical dressing until follow up in the clinic. If the edges start to pull up, may reinforce with tape. If the dressing is no longer working, may remove and cover with gauze and tape, but must keep the area dry and clean.  Call with any questions or concerns.  ? Constipation Prevention    Complete by: As directed ?  ? Drink plenty of fluids.  Prune juice may be helpful.  You may use a stool softener, such as Colace (over the counter) 100 mg twice a day.  Use MiraLax (over the counter) for constipation as needed.  ? Diet - low sodium heart healthy   Complete by: As directed ?  ? Increase activity slowly as tolerated   Complete by: As directed ?  ? Weight bearing as tolerated with assist device (walker, cane, etc)  as directed, use it as long as suggested by your surgeon or therapist, typically at least 4-6 weeks.  ? Post-operative opioid taper instructions:   Complete by: As directed ?  ? POST-OPERATIVE OPIOID TAPER INSTRUCTIONS: ?It is important to wean off of your opioid medication as soon as possible. If you do not need pain medication after your surgery it is ok to stop day one. ?Opioids include: ?Codeine, Hydrocodone(Norco, Vicodin), Oxycodone(Percocet, oxycontin) and hydromorphone amongst others.  ?Long term and even short term use of opiods can cause: ?Increased pain response ?Dependence ?Constipation ?Depression ?Respiratory depression ?And more.  ?Withdrawal symptoms can include ?Flu like symptoms ?Nausea, vomiting ?And more ?Techniques to manage these symptoms ?Hydrate well ?Eat regular healthy meals ?Stay active ?Use relaxation techniques(deep breathing, meditating, yoga) ?Do Not substitute Alcohol to help with tapering ?If you have been on opioids for less than two weeks and do not have pain than it is ok to stop all together.  ?Plan to wean off of opioids ?This plan should start within one week post op of your joint replacement. ?Maintain the same interval or time between taking each dose and first decrease the dose.  ?Cut the total daily intake of opioids by one tablet each day ?Next start to increase the time between doses. ?The last dose that should be eliminated is the evening dose.  ? ?  ? TED hose   Complete by: As directed ?  ? Use stockings (TED hose) for 2 weeks on both leg(s).   You may remove them at night for sleeping.  ? ?  ? ? ? Follow-up Information   ? ? Irving Copas, PA-C. Go on 03/02/2022.   ?Specialty: Orthopedic Surgery ?Why: You are scheduled for a follow up a

## 2022-02-25 DIAGNOSIS — M25661 Stiffness of right knee, not elsewhere classified: Secondary | ICD-10-CM | POA: Diagnosis not present

## 2022-02-28 DIAGNOSIS — M25661 Stiffness of right knee, not elsewhere classified: Secondary | ICD-10-CM | POA: Diagnosis not present

## 2022-03-02 DIAGNOSIS — M25661 Stiffness of right knee, not elsewhere classified: Secondary | ICD-10-CM | POA: Diagnosis not present

## 2022-03-04 DIAGNOSIS — M25661 Stiffness of right knee, not elsewhere classified: Secondary | ICD-10-CM | POA: Diagnosis not present

## 2022-03-07 DIAGNOSIS — M25661 Stiffness of right knee, not elsewhere classified: Secondary | ICD-10-CM | POA: Diagnosis not present

## 2022-03-09 DIAGNOSIS — M25661 Stiffness of right knee, not elsewhere classified: Secondary | ICD-10-CM | POA: Diagnosis not present

## 2022-03-14 DIAGNOSIS — M25661 Stiffness of right knee, not elsewhere classified: Secondary | ICD-10-CM | POA: Diagnosis not present

## 2022-03-17 DIAGNOSIS — M25661 Stiffness of right knee, not elsewhere classified: Secondary | ICD-10-CM | POA: Diagnosis not present

## 2022-03-22 DIAGNOSIS — M25661 Stiffness of right knee, not elsewhere classified: Secondary | ICD-10-CM | POA: Diagnosis not present

## 2022-03-24 DIAGNOSIS — M25661 Stiffness of right knee, not elsewhere classified: Secondary | ICD-10-CM | POA: Diagnosis not present

## 2022-04-06 DIAGNOSIS — Z96651 Presence of right artificial knee joint: Secondary | ICD-10-CM | POA: Diagnosis not present

## 2022-04-06 DIAGNOSIS — Z471 Aftercare following joint replacement surgery: Secondary | ICD-10-CM | POA: Diagnosis not present

## 2022-06-28 DIAGNOSIS — C44311 Basal cell carcinoma of skin of nose: Secondary | ICD-10-CM | POA: Diagnosis not present

## 2022-06-29 DIAGNOSIS — H1031 Unspecified acute conjunctivitis, right eye: Secondary | ICD-10-CM | POA: Diagnosis not present

## 2022-06-29 DIAGNOSIS — H02532 Eyelid retraction right lower eyelid: Secondary | ICD-10-CM | POA: Diagnosis not present

## 2022-06-29 DIAGNOSIS — Z481 Encounter for planned postprocedural wound closure: Secondary | ICD-10-CM | POA: Diagnosis not present

## 2022-06-29 DIAGNOSIS — C441122 Basal cell carcinoma of skin of right lower eyelid, including canthus: Secondary | ICD-10-CM | POA: Diagnosis not present

## 2022-06-29 DIAGNOSIS — S01111A Laceration without foreign body of right eyelid and periocular area, initial encounter: Secondary | ICD-10-CM | POA: Diagnosis not present

## 2022-06-29 DIAGNOSIS — Z85828 Personal history of other malignant neoplasm of skin: Secondary | ICD-10-CM | POA: Diagnosis not present

## 2022-06-29 DIAGNOSIS — Z483 Aftercare following surgery for neoplasm: Secondary | ICD-10-CM | POA: Diagnosis not present

## 2022-07-13 DIAGNOSIS — H16211 Exposure keratoconjunctivitis, right eye: Secondary | ICD-10-CM | POA: Diagnosis not present

## 2022-07-13 DIAGNOSIS — H02112 Cicatricial ectropion of right lower eyelid: Secondary | ICD-10-CM | POA: Diagnosis not present

## 2022-08-09 DIAGNOSIS — Z08 Encounter for follow-up examination after completed treatment for malignant neoplasm: Secondary | ICD-10-CM | POA: Diagnosis not present

## 2022-08-09 DIAGNOSIS — Z85828 Personal history of other malignant neoplasm of skin: Secondary | ICD-10-CM | POA: Diagnosis not present

## 2022-08-09 DIAGNOSIS — L814 Other melanin hyperpigmentation: Secondary | ICD-10-CM | POA: Diagnosis not present

## 2022-08-09 DIAGNOSIS — C44311 Basal cell carcinoma of skin of nose: Secondary | ICD-10-CM | POA: Diagnosis not present

## 2022-08-09 DIAGNOSIS — D485 Neoplasm of uncertain behavior of skin: Secondary | ICD-10-CM | POA: Diagnosis not present

## 2022-08-09 DIAGNOSIS — C44212 Basal cell carcinoma of skin of right ear and external auricular canal: Secondary | ICD-10-CM | POA: Diagnosis not present

## 2022-08-09 DIAGNOSIS — D1801 Hemangioma of skin and subcutaneous tissue: Secondary | ICD-10-CM | POA: Diagnosis not present

## 2022-08-09 DIAGNOSIS — L821 Other seborrheic keratosis: Secondary | ICD-10-CM | POA: Diagnosis not present

## 2022-08-22 DIAGNOSIS — C44311 Basal cell carcinoma of skin of nose: Secondary | ICD-10-CM | POA: Diagnosis not present

## 2022-09-13 DIAGNOSIS — C44212 Basal cell carcinoma of skin of right ear and external auricular canal: Secondary | ICD-10-CM | POA: Diagnosis not present

## 2022-09-30 DIAGNOSIS — E782 Mixed hyperlipidemia: Secondary | ICD-10-CM | POA: Diagnosis not present

## 2022-09-30 DIAGNOSIS — E890 Postprocedural hypothyroidism: Secondary | ICD-10-CM | POA: Diagnosis not present

## 2022-09-30 DIAGNOSIS — Z85850 Personal history of malignant neoplasm of thyroid: Secondary | ICD-10-CM | POA: Diagnosis not present

## 2022-09-30 DIAGNOSIS — Z79890 Hormone replacement therapy (postmenopausal): Secondary | ICD-10-CM | POA: Diagnosis not present

## 2022-09-30 DIAGNOSIS — R7303 Prediabetes: Secondary | ICD-10-CM | POA: Diagnosis not present

## 2022-09-30 DIAGNOSIS — M8580 Other specified disorders of bone density and structure, unspecified site: Secondary | ICD-10-CM | POA: Diagnosis not present

## 2022-10-27 DIAGNOSIS — H0279 Other degenerative disorders of eyelid and periocular area: Secondary | ICD-10-CM | POA: Diagnosis not present

## 2022-10-27 DIAGNOSIS — C441122 Basal cell carcinoma of skin of right lower eyelid, including canthus: Secondary | ICD-10-CM | POA: Diagnosis not present

## 2022-10-27 DIAGNOSIS — Z09 Encounter for follow-up examination after completed treatment for conditions other than malignant neoplasm: Secondary | ICD-10-CM | POA: Diagnosis not present

## 2022-10-27 DIAGNOSIS — H2513 Age-related nuclear cataract, bilateral: Secondary | ICD-10-CM | POA: Diagnosis not present

## 2023-02-09 DIAGNOSIS — Z85828 Personal history of other malignant neoplasm of skin: Secondary | ICD-10-CM | POA: Diagnosis not present

## 2023-02-09 DIAGNOSIS — L814 Other melanin hyperpigmentation: Secondary | ICD-10-CM | POA: Diagnosis not present

## 2023-02-09 DIAGNOSIS — L821 Other seborrheic keratosis: Secondary | ICD-10-CM | POA: Diagnosis not present

## 2023-02-09 DIAGNOSIS — D1801 Hemangioma of skin and subcutaneous tissue: Secondary | ICD-10-CM | POA: Diagnosis not present

## 2023-02-09 DIAGNOSIS — Z08 Encounter for follow-up examination after completed treatment for malignant neoplasm: Secondary | ICD-10-CM | POA: Diagnosis not present

## 2023-03-31 DIAGNOSIS — E890 Postprocedural hypothyroidism: Secondary | ICD-10-CM | POA: Diagnosis not present

## 2023-03-31 DIAGNOSIS — E782 Mixed hyperlipidemia: Secondary | ICD-10-CM | POA: Diagnosis not present

## 2023-03-31 DIAGNOSIS — R7303 Prediabetes: Secondary | ICD-10-CM | POA: Diagnosis not present

## 2023-03-31 DIAGNOSIS — Z85850 Personal history of malignant neoplasm of thyroid: Secondary | ICD-10-CM | POA: Diagnosis not present

## 2023-03-31 DIAGNOSIS — M8580 Other specified disorders of bone density and structure, unspecified site: Secondary | ICD-10-CM | POA: Diagnosis not present

## 2023-03-31 DIAGNOSIS — I10 Essential (primary) hypertension: Secondary | ICD-10-CM | POA: Diagnosis not present

## 2023-04-04 DIAGNOSIS — F341 Dysthymic disorder: Secondary | ICD-10-CM | POA: Diagnosis not present

## 2023-04-04 DIAGNOSIS — Z85850 Personal history of malignant neoplasm of thyroid: Secondary | ICD-10-CM | POA: Diagnosis not present

## 2023-04-04 DIAGNOSIS — J310 Chronic rhinitis: Secondary | ICD-10-CM | POA: Diagnosis not present

## 2023-04-04 DIAGNOSIS — M8580 Other specified disorders of bone density and structure, unspecified site: Secondary | ICD-10-CM | POA: Diagnosis not present

## 2023-04-04 DIAGNOSIS — E782 Mixed hyperlipidemia: Secondary | ICD-10-CM | POA: Diagnosis not present

## 2023-04-04 DIAGNOSIS — E890 Postprocedural hypothyroidism: Secondary | ICD-10-CM | POA: Diagnosis not present

## 2023-04-04 DIAGNOSIS — I4510 Unspecified right bundle-branch block: Secondary | ICD-10-CM | POA: Diagnosis not present

## 2023-04-04 DIAGNOSIS — R7303 Prediabetes: Secondary | ICD-10-CM | POA: Diagnosis not present

## 2023-04-04 DIAGNOSIS — I10 Essential (primary) hypertension: Secondary | ICD-10-CM | POA: Diagnosis not present

## 2023-04-04 DIAGNOSIS — R0981 Nasal congestion: Secondary | ICD-10-CM | POA: Diagnosis not present

## 2023-04-04 DIAGNOSIS — R000 Tachycardia, unspecified: Secondary | ICD-10-CM | POA: Diagnosis not present

## 2023-04-04 DIAGNOSIS — T485X5A Adverse effect of other anti-common-cold drugs, initial encounter: Secondary | ICD-10-CM | POA: Diagnosis not present

## 2023-05-17 DIAGNOSIS — M722 Plantar fascial fibromatosis: Secondary | ICD-10-CM | POA: Diagnosis not present

## 2023-06-28 DIAGNOSIS — M722 Plantar fascial fibromatosis: Secondary | ICD-10-CM | POA: Diagnosis not present

## 2023-07-18 DIAGNOSIS — M79671 Pain in right foot: Secondary | ICD-10-CM | POA: Diagnosis not present

## 2023-07-18 DIAGNOSIS — M7671 Peroneal tendinitis, right leg: Secondary | ICD-10-CM | POA: Diagnosis not present

## 2023-07-18 DIAGNOSIS — M79672 Pain in left foot: Secondary | ICD-10-CM | POA: Diagnosis not present

## 2023-08-02 ENCOUNTER — Other Ambulatory Visit: Payer: Self-pay | Admitting: Nurse Practitioner

## 2023-08-02 DIAGNOSIS — Z800 Family history of malignant neoplasm of digestive organs: Secondary | ICD-10-CM | POA: Diagnosis not present

## 2023-08-02 DIAGNOSIS — R109 Unspecified abdominal pain: Secondary | ICD-10-CM | POA: Diagnosis not present

## 2023-08-14 ENCOUNTER — Other Ambulatory Visit: Payer: Medicare HMO

## 2023-08-15 DIAGNOSIS — L821 Other seborrheic keratosis: Secondary | ICD-10-CM | POA: Diagnosis not present

## 2023-08-15 DIAGNOSIS — Z08 Encounter for follow-up examination after completed treatment for malignant neoplasm: Secondary | ICD-10-CM | POA: Diagnosis not present

## 2023-08-15 DIAGNOSIS — C4441 Basal cell carcinoma of skin of scalp and neck: Secondary | ICD-10-CM | POA: Diagnosis not present

## 2023-08-15 DIAGNOSIS — L814 Other melanin hyperpigmentation: Secondary | ICD-10-CM | POA: Diagnosis not present

## 2023-08-15 DIAGNOSIS — D225 Melanocytic nevi of trunk: Secondary | ICD-10-CM | POA: Diagnosis not present

## 2023-08-15 DIAGNOSIS — D485 Neoplasm of uncertain behavior of skin: Secondary | ICD-10-CM | POA: Diagnosis not present

## 2023-08-15 DIAGNOSIS — Z85828 Personal history of other malignant neoplasm of skin: Secondary | ICD-10-CM | POA: Diagnosis not present

## 2023-08-15 DIAGNOSIS — L570 Actinic keratosis: Secondary | ICD-10-CM | POA: Diagnosis not present

## 2023-08-17 DIAGNOSIS — Z800 Family history of malignant neoplasm of digestive organs: Secondary | ICD-10-CM | POA: Diagnosis not present

## 2023-08-17 DIAGNOSIS — D120 Benign neoplasm of cecum: Secondary | ICD-10-CM | POA: Diagnosis not present

## 2023-08-17 DIAGNOSIS — K219 Gastro-esophageal reflux disease without esophagitis: Secondary | ICD-10-CM | POA: Diagnosis not present

## 2023-08-17 DIAGNOSIS — Z09 Encounter for follow-up examination after completed treatment for conditions other than malignant neoplasm: Secondary | ICD-10-CM | POA: Diagnosis not present

## 2023-08-17 DIAGNOSIS — D123 Benign neoplasm of transverse colon: Secondary | ICD-10-CM | POA: Diagnosis not present

## 2023-08-17 DIAGNOSIS — D122 Benign neoplasm of ascending colon: Secondary | ICD-10-CM | POA: Diagnosis not present

## 2023-08-17 DIAGNOSIS — Z860101 Personal history of adenomatous and serrated colon polyps: Secondary | ICD-10-CM | POA: Diagnosis not present

## 2023-08-17 DIAGNOSIS — K5730 Diverticulosis of large intestine without perforation or abscess without bleeding: Secondary | ICD-10-CM | POA: Diagnosis not present

## 2023-08-17 DIAGNOSIS — K921 Melena: Secondary | ICD-10-CM | POA: Diagnosis not present

## 2023-08-21 DIAGNOSIS — D120 Benign neoplasm of cecum: Secondary | ICD-10-CM | POA: Diagnosis not present

## 2023-08-21 DIAGNOSIS — D123 Benign neoplasm of transverse colon: Secondary | ICD-10-CM | POA: Diagnosis not present

## 2023-08-21 DIAGNOSIS — D122 Benign neoplasm of ascending colon: Secondary | ICD-10-CM | POA: Diagnosis not present

## 2023-08-31 ENCOUNTER — Other Ambulatory Visit: Payer: Medicare HMO

## 2023-09-04 ENCOUNTER — Ambulatory Visit
Admission: RE | Admit: 2023-09-04 | Discharge: 2023-09-04 | Disposition: A | Discharge status: Home / Self Care | Payer: Medicare HMO | Source: Ambulatory Visit | Attending: Nurse Practitioner | Admitting: Nurse Practitioner

## 2023-09-04 DIAGNOSIS — R109 Unspecified abdominal pain: Secondary | ICD-10-CM

## 2023-09-04 DIAGNOSIS — K7689 Other specified diseases of liver: Secondary | ICD-10-CM | POA: Diagnosis not present

## 2023-09-04 DIAGNOSIS — N2889 Other specified disorders of kidney and ureter: Secondary | ICD-10-CM | POA: Diagnosis not present

## 2023-09-06 DIAGNOSIS — C4441 Basal cell carcinoma of skin of scalp and neck: Secondary | ICD-10-CM | POA: Diagnosis not present

## 2023-10-02 DIAGNOSIS — R17 Unspecified jaundice: Secondary | ICD-10-CM | POA: Diagnosis not present

## 2024-02-14 DIAGNOSIS — Z08 Encounter for follow-up examination after completed treatment for malignant neoplasm: Secondary | ICD-10-CM | POA: Diagnosis not present

## 2024-02-14 DIAGNOSIS — D1801 Hemangioma of skin and subcutaneous tissue: Secondary | ICD-10-CM | POA: Diagnosis not present

## 2024-02-14 DIAGNOSIS — L814 Other melanin hyperpigmentation: Secondary | ICD-10-CM | POA: Diagnosis not present

## 2024-02-14 DIAGNOSIS — L821 Other seborrheic keratosis: Secondary | ICD-10-CM | POA: Diagnosis not present

## 2024-02-14 DIAGNOSIS — Z85828 Personal history of other malignant neoplasm of skin: Secondary | ICD-10-CM | POA: Diagnosis not present

## 2024-03-20 DIAGNOSIS — M8580 Other specified disorders of bone density and structure, unspecified site: Secondary | ICD-10-CM | POA: Diagnosis not present

## 2024-03-20 DIAGNOSIS — I10 Essential (primary) hypertension: Secondary | ICD-10-CM | POA: Diagnosis not present

## 2024-03-20 DIAGNOSIS — Z85850 Personal history of malignant neoplasm of thyroid: Secondary | ICD-10-CM | POA: Diagnosis not present

## 2024-03-20 DIAGNOSIS — R7303 Prediabetes: Secondary | ICD-10-CM | POA: Diagnosis not present

## 2024-03-20 DIAGNOSIS — F341 Dysthymic disorder: Secondary | ICD-10-CM | POA: Diagnosis not present

## 2024-03-20 DIAGNOSIS — E782 Mixed hyperlipidemia: Secondary | ICD-10-CM | POA: Diagnosis not present

## 2024-03-20 DIAGNOSIS — E890 Postprocedural hypothyroidism: Secondary | ICD-10-CM | POA: Diagnosis not present

## 2024-03-25 DIAGNOSIS — H9190 Unspecified hearing loss, unspecified ear: Secondary | ICD-10-CM | POA: Diagnosis not present

## 2024-04-08 DIAGNOSIS — R17 Unspecified jaundice: Secondary | ICD-10-CM | POA: Diagnosis not present

## 2024-04-08 DIAGNOSIS — K219 Gastro-esophageal reflux disease without esophagitis: Secondary | ICD-10-CM | POA: Diagnosis not present

## 2024-04-09 DIAGNOSIS — H903 Sensorineural hearing loss, bilateral: Secondary | ICD-10-CM | POA: Diagnosis not present

## 2024-07-18 DIAGNOSIS — K760 Fatty (change of) liver, not elsewhere classified: Secondary | ICD-10-CM | POA: Diagnosis not present

## 2024-07-18 DIAGNOSIS — Z800 Family history of malignant neoplasm of digestive organs: Secondary | ICD-10-CM | POA: Diagnosis not present

## 2024-07-18 DIAGNOSIS — K219 Gastro-esophageal reflux disease without esophagitis: Secondary | ICD-10-CM | POA: Diagnosis not present

## 2024-07-18 DIAGNOSIS — Z86018 Personal history of other benign neoplasm: Secondary | ICD-10-CM | POA: Diagnosis not present

## 2024-08-19 DIAGNOSIS — L821 Other seborrheic keratosis: Secondary | ICD-10-CM | POA: Diagnosis not present

## 2024-08-19 DIAGNOSIS — L814 Other melanin hyperpigmentation: Secondary | ICD-10-CM | POA: Diagnosis not present

## 2024-08-19 DIAGNOSIS — D1801 Hemangioma of skin and subcutaneous tissue: Secondary | ICD-10-CM | POA: Diagnosis not present

## 2024-08-19 DIAGNOSIS — Z08 Encounter for follow-up examination after completed treatment for malignant neoplasm: Secondary | ICD-10-CM | POA: Diagnosis not present

## 2024-08-19 DIAGNOSIS — Z85828 Personal history of other malignant neoplasm of skin: Secondary | ICD-10-CM | POA: Diagnosis not present

## 2024-08-29 DIAGNOSIS — Z008 Encounter for other general examination: Secondary | ICD-10-CM | POA: Diagnosis not present

## 2024-11-04 ENCOUNTER — Ambulatory Visit: Attending: Cardiology | Admitting: Cardiology

## 2024-11-04 ENCOUNTER — Encounter: Payer: Self-pay | Admitting: Cardiology

## 2024-11-04 VITALS — BP 110/80 | HR 79 | Ht 72.0 in | Wt 293.0 lb

## 2024-11-04 DIAGNOSIS — Z96651 Presence of right artificial knee joint: Secondary | ICD-10-CM | POA: Diagnosis not present

## 2024-11-04 DIAGNOSIS — I471 Supraventricular tachycardia, unspecified: Secondary | ICD-10-CM

## 2024-11-04 DIAGNOSIS — R0789 Other chest pain: Secondary | ICD-10-CM

## 2024-11-04 MED ORDER — METOPROLOL TARTRATE 50 MG PO TABS: 50.0000 mg | ORAL_TABLET | ORAL | 0 refills | Status: AC

## 2024-11-04 NOTE — Progress Notes (Signed)
 " Cardiology Office Note:  .   Date:  11/04/2024  ID:  Joshua Graves, DOB 02/10/1951, MRN [id number] PCP: Loring Tanda Mae, MD  [facility] Providers Cardiologist:  None    History of Present Illness: Joshua Graves is a 74 y.o. male Discussed the use of AI scribe software for clinical note transcription with the patient, who gave verbal consent to proceed.  History of Present Illness Joshua Graves is a 74 year old male with supraventricular tachycardia who presents with chest pain. He was referred by [facility] for cardiology evaluation after wearing a Holter monitor.  He has been experiencing chest pain primarily when pushing up from a chair, accompanied by occasional palpitations. The pain is located in the mid sternum and is associated with mild shortness of breath for the past two years. The chest pain is not consistently associated with physical exertion, as it occurs when getting up from a recliner rather than during exercise on his bike.  He has a history of supraventricular tachycardia with 59 episodes recorded, the longest lasting ten beats with an average heart rate of 150 beats per minute. He also experiences occasional premature atrial contractions and rare premature ventricular contractions, described as feeling like a 'flutter' or 'blub, blub' in his heart.  He quit smoking in 2019 after a history of smoking two packs per day. He is retired and continues to engage in physical activity by working out on his exercise bike.  His sister had a rapid heartbeat and eventually required a pacemaker, which improved her condition. His brother had an aortic issue that was monitored and reportedly improved over time. His mother had Alzheimer's disease and possibly mini strokes, and she may have died from a heart attack. His father had a good heart health report in his forties, although he was monitored for heart issues at that time.      ROS: Chest pain as above.  No  syncope.  Studies Reviewed: SABRA   EKG Interpretation Date/Time:  Monday November 04 2024 14:18:24 EST Ventricular Rate:  77 PR Interval:  184 QRS Duration:  120 QT Interval:  384 QTC Calculation: 434 R Axis:   -23  Text Interpretation: Normal sinus rhythm Right bundle branch block Inferior infarct (cited on or before 04-Nov-2024) When compared with ECG of 08-Feb-2022 11:27, No significant change was found Confirmed by Jeffrie Anes ([id number]) on 11/04/2024 2:22:25 PM    Results Diagnostic Holter monitor: Average heart rate 75 beats per minute in sinus rhythm; 59 episodes of supraventricular tachycardia, longest episode 10 beats with average heart rate 150; occasional premature atrial contractions; rare premature ventricular contractions Risk Assessment/Calculations:            Physical Exam:   VS:  BP 110/80 (BP Location: Left Arm, Patient Position: Sitting, Cuff Size: Normal)   Pulse 79   Ht 6' (1.829 m)   Wt 293 lb (132.9 kg)   SpO2 94%   BMI 39.74 kg/m    Wt Readings from Last 3 Encounters:  11/04/24 293 lb (132.9 kg)  02/15/22 281 lb 4 oz (127.6 kg)  02/08/22 281 lb 4 oz (127.6 kg)    GEN: Well nourished, well developed in no acute distress NECK: No JVD; No carotid bruits CARDIAC: RRR, no murmurs, no rubs, no gallops RESPIRATORY:  Clear to auscultation without rales, wheezing or rhonchi  ABDOMEN: Soft, non-tender, non-distended EXTREMITIES:  No edema; No deformity   ASSESSMENT AND PLAN: .  Assessment and Plan Assessment & Plan Atypical chest pain Intermittent chest pain primarily when rising from a recliner, with mild dyspnea. Differential includes cardiac etiology. Coronary artery disease considered.  - Ordered coronary CT scan to evaluate for coronary artery disease.  Supraventricular tachycardia Episodes of SVT with longest episode lasting ten beats and average heart rate of 150 bpm. Occasional premature atrial and ventricular contractions noted.  Likely atrial  tachycardia although I do not have access to the actual longest episode.  Under media I have captured the summary page.  No immediate need for pacemaker, reassurance, his sister has a pacemaker. Symptoms include occasional palpitations and mild dyspnea. Family history of cardiac issues discussed. - Ordered echocardiogram to assess heart structure and function.  Hyperlipidemia - Continue with atorvastatin  20 mg once a day last LDL 61 on 09/05/2024  Blood pressure is well-controlled without medication management.  Creatinine 0.7, potassium 4.9 ALT 17 TSH 2.4  Hypothyroidism - Takes 200 mcg of Synthroid .  Per primary team.         Dispo: We will follow-up with results of study  Signed, Oneil Parchment, MD  "

## 2024-11-04 NOTE — Patient Instructions (Signed)
 Medication Instructions:  The current medical regimen is effective;  continue present plan and medications.  *If you need a refill on your cardiac medications before your next appointment, please call your pharmacy*  Testing/Procedures: Your physician has requested that you have an echocardiogram. Echocardiography is a painless test that uses sound waves to create images of your heart. It provides your doctor with information about the size and shape of your heart and how well your heart's chambers and valves are working. This procedure takes approximately one hour. There are no restrictions for this procedure. Please do NOT wear cologne, perfume, aftershave, or lotions (deodorant is allowed). Please arrive 15 minutes prior to your appointment time.  Please note: We ask at that you not bring children with you during ultrasound (echo/ vascular) testing. Due to room size and safety concerns, children are not allowed in the ultrasound rooms during exams. Our front office staff cannot provide observation of children in our lobby area while testing is being conducted. An adult accompanying a patient to their appointment will only be allowed in the ultrasound room at the discretion of the ultrasound technician under special circumstances. We apologize for any inconvenience.    Your cardiac CT will be scheduled at:  [facility] [address]  [place], [place] [address]  Please enter the parking lot using the [address] entrance and use the FREE valet service at the patient drop-off area. Enter the building and check-in with registration on the main floor.  Please follow these instructions carefully (unless otherwise directed):  An IV will be required for this test and Nitroglycerin will be given.  Hold all erectile dysfunction medications at least 3 days (72 hrs) prior to test. (Ie viagra, cialis, sildenafil, tadalafil, etc)   On the Night Before the Test: Be  sure to Drink plenty of water. Do not consume any caffeinated/decaffeinated beverages or chocolate 12 hours prior to your test. Do not take any antihistamines 12 hours prior to your test.  On the Day of the Test: Drink plenty of water until 1 hour prior to the test. Do not eat any food 1 hour prior to test. You may take your regular medications prior to the test.  Take metoprolol (Lopressor) two hours prior to test. If you take Furosemide/Hydrochlorothiazide/Spironolactone/Chlorthalidone, please HOLD on the morning of the test. Patients who wear a continuous glucose monitor MUST remove the device prior to scanning.      After the Test: Drink plenty of water. After receiving IV contrast, you may experience a mild flushed feeling. This is normal. On occasion, you may experience a mild rash up to 24 hours after the test. This is not dangerous. If this occurs, you can take Benadryl 25 mg, Zyrtec, Claritin, or Allegra and increase your fluid intake. (Patients taking Tikosyn should avoid Benadryl, and may take Zyrtec, Claritin, or Allegra) If you experience trouble breathing, this can be serious. If it is severe call 911 IMMEDIATELY. If it is mild, please call our office.  We will call to schedule your test 2-4 weeks out understanding that some insurance companies will need an authorization prior to the service being performed.   For more information and frequently asked questions, please visit our website : [email or web address]  For non-scheduling related questions, please contact the cardiac imaging nurse navigator should you have any questions/concerns: Cardiac Imaging Nurse Navigators Direct Office Dial: [phone]   For scheduling needs, including cancellations and rescheduling, please call Brittany, [phone].   Follow-Up: At  [facility], you and your health needs are our priority.  As part of our continuing mission to provide you with exceptional heart care,  our providers are all part of one team.  This team includes your primary Cardiologist (physician) and Advanced Practice Providers or APPs (Physician Assistants and Nurse Practitioners) who all work together to provide you with the care you need, when you need it.  Your next appointment:   Follow up will be based on the results of the above testing.   We recommend signing up for the patient portal called MyChart.  Sign up information is provided on this After Visit Summary.  MyChart is used to connect with patients for Virtual Visits (Telemedicine).  Patients are able to view lab/test results, encounter notes, upcoming appointments, etc.  Non-urgent messages can be sent to your provider as well.   To learn more about what you can do with MyChart, go to [email or web address].

## 2024-11-19 ENCOUNTER — Ambulatory Visit (HOSPITAL_COMMUNITY)

## 2024-12-02 ENCOUNTER — Ambulatory Visit (HOSPITAL_COMMUNITY)

## 2024-12-11 ENCOUNTER — Ambulatory Visit (HOSPITAL_COMMUNITY)
